# Patient Record
Sex: Female | Born: 1975 | Race: Black or African American | Hispanic: No | Marital: Single | Smoking: Never smoker
Health system: Southern US, Community
[De-identification: ages and names within clinical notes are randomized; demographics above are authoritative.]

## PROBLEM LIST (undated history)

## (undated) DIAGNOSIS — B999 Unspecified infectious disease: Secondary | ICD-10-CM

## (undated) HISTORY — PX: NO PAST SURGERIES: SHX2092

---

## 2003-08-04 ENCOUNTER — Inpatient Hospital Stay (HOSPITAL_COMMUNITY): Admission: AD | Admit: 2003-08-04 | Discharge: 2003-08-06 | Payer: Self-pay | Admitting: Family Medicine

## 2003-10-26 ENCOUNTER — Ambulatory Visit (HOSPITAL_COMMUNITY): Admission: RE | Admit: 2003-10-26 | Discharge: 2003-10-26 | Payer: Self-pay | Admitting: *Deleted

## 2004-01-08 ENCOUNTER — Ambulatory Visit (HOSPITAL_COMMUNITY): Admission: RE | Admit: 2004-01-08 | Discharge: 2004-01-08 | Payer: Self-pay | Admitting: *Deleted

## 2004-03-08 ENCOUNTER — Inpatient Hospital Stay (HOSPITAL_COMMUNITY): Admission: AD | Admit: 2004-03-08 | Discharge: 2004-03-11 | Payer: Self-pay | Admitting: *Deleted

## 2005-10-21 ENCOUNTER — Inpatient Hospital Stay (HOSPITAL_COMMUNITY): Admission: RE | Admit: 2005-10-21 | Discharge: 2005-10-23 | Payer: Self-pay | Admitting: Obstetrics and Gynecology

## 2011-10-07 NOTE — L&D Delivery Note (Signed)
Delivery Note At 5:43 AM a viable and healthy female was delivered via Vaginal, Spontaneous Delivery (Presentation: Occiput Anterior).  APGAR: 8, 9; weight 7 lb 7.8 oz (3396 g).   Placenta status: Intact, Spontaneous.  Cord: 3 vessel with the following complications: None.   Anesthesia: None  Episiotomy: None Lacerations: None Suture Repair: n/a Est. Blood Loss (mL): 300  Mom to postpartum.  Baby to nursery-stable.  Marikay Alar 04/28/2012, 6:22 AM  I have seen and examined this patient and I agree with the above. Cam Hai 6:48 AM 04/28/2012

## 2011-10-13 LAB — OB RESULTS CONSOLE GC/CHLAMYDIA
Chlamydia: NEGATIVE
Gonorrhea: NEGATIVE

## 2011-10-13 LAB — OB RESULTS CONSOLE ABO/RH: RH Type: POSITIVE

## 2012-03-12 ENCOUNTER — Other Ambulatory Visit: Payer: Self-pay | Admitting: Obstetrics and Gynecology

## 2012-03-12 ENCOUNTER — Other Ambulatory Visit (HOSPITAL_COMMUNITY)
Admission: RE | Admit: 2012-03-12 | Discharge: 2012-03-12 | Disposition: A | Discharge status: Home / Self Care | Payer: Commercial Indemnity | Source: Ambulatory Visit | Attending: Obstetrics and Gynecology | Admitting: Obstetrics and Gynecology

## 2012-03-12 DIAGNOSIS — Z01419 Encounter for gynecological examination (general) (routine) without abnormal findings: Secondary | ICD-10-CM | POA: Insufficient documentation

## 2012-03-12 DIAGNOSIS — Z1159 Encounter for screening for other viral diseases: Secondary | ICD-10-CM | POA: Insufficient documentation

## 2012-04-12 LAB — OB RESULTS CONSOLE GC/CHLAMYDIA: Gonorrhea: NEGATIVE

## 2012-04-12 LAB — OB RESULTS CONSOLE GBS: GBS: NEGATIVE

## 2012-04-27 ENCOUNTER — Inpatient Hospital Stay (HOSPITAL_COMMUNITY)
Admission: AD | Admit: 2012-04-27 | Discharge: 2012-04-30 | DRG: 767 | Disposition: A | Discharge status: Home / Self Care | Payer: Managed Care, Other (non HMO) | Source: Ambulatory Visit | Attending: Obstetrics & Gynecology | Admitting: Obstetrics & Gynecology

## 2012-04-27 ENCOUNTER — Encounter (HOSPITAL_COMMUNITY): Payer: Self-pay | Admitting: *Deleted

## 2012-04-27 DIAGNOSIS — B999 Unspecified infectious disease: Secondary | ICD-10-CM | POA: Insufficient documentation

## 2012-04-27 DIAGNOSIS — Z302 Encounter for sterilization: Secondary | ICD-10-CM

## 2012-04-27 DIAGNOSIS — O09529 Supervision of elderly multigravida, unspecified trimester: Secondary | ICD-10-CM | POA: Diagnosis present

## 2012-04-27 HISTORY — DX: Unspecified infectious disease: B99.9

## 2012-04-27 LAB — CBC
HCT: 36.5 % (ref 36.0–46.0)
Hemoglobin: 12 g/dL (ref 12.0–15.0)
RBC: 4.12 MIL/uL (ref 3.87–5.11)
WBC: 9.4 10*3/uL (ref 4.0–10.5)

## 2012-04-27 MED ORDER — ACETAMINOPHEN 325 MG PO TABS: 650.0000 mg | ORAL_TABLET | ORAL | Status: DC | PRN

## 2012-04-27 MED ORDER — OXYTOCIN BOLUS FROM INFUSION
250.0000 mL | Freq: Once | INTRAVENOUS | Status: AC
  Administered 2012-04-28: 250 mL via INTRAVENOUS
  Filled 2012-04-27: qty 500

## 2012-04-27 MED ORDER — BUTORPHANOL TARTRATE 2 MG/ML IJ SOLN
1.0000 mg | INTRAMUSCULAR | Status: DC | PRN
  Administered 2012-04-28 (×2): 1 mg via INTRAVENOUS
  Filled 2012-04-27 (×2): qty 1

## 2012-04-27 MED ORDER — OXYCODONE-ACETAMINOPHEN 5-325 MG PO TABS: 1.0000 | ORAL_TABLET | ORAL | Status: DC | PRN

## 2012-04-27 MED ORDER — LACTATED RINGERS IV SOLN: 500.0000 mL | INTRAVENOUS | Status: DC | PRN

## 2012-04-27 MED ORDER — LACTATED RINGERS IV SOLN
INTRAVENOUS | Status: DC
  Administered 2012-04-27: 23:00:00 via INTRAVENOUS

## 2012-04-27 MED ORDER — FLEET ENEMA 7-19 GM/118ML RE ENEM: 1.0000 | ENEMA | RECTAL | Status: DC | PRN

## 2012-04-27 MED ORDER — IBUPROFEN 600 MG PO TABS: 600.0000 mg | ORAL_TABLET | Freq: Four times a day (QID) | ORAL | Status: DC | PRN

## 2012-04-27 MED ORDER — LIDOCAINE HCL (PF) 1 % IJ SOLN
30.0000 mL | INTRAMUSCULAR | Status: DC | PRN
  Filled 2012-04-27: qty 30

## 2012-04-27 MED ORDER — CITRIC ACID-SODIUM CITRATE 334-500 MG/5ML PO SOLN: 30.0000 mL | ORAL | Status: DC | PRN

## 2012-04-27 MED ORDER — OXYTOCIN 40 UNITS IN LACTATED RINGERS INFUSION - SIMPLE MED
62.5000 mL/h | Freq: Once | INTRAVENOUS | Status: AC
  Administered 2012-04-28: 62.5 mL/h via INTRAVENOUS
  Filled 2012-04-27: qty 1000

## 2012-04-27 MED ORDER — ACYCLOVIR 400 MG PO TABS
400.0000 mg | ORAL_TABLET | Freq: Two times a day (BID) | ORAL | Status: DC
  Administered 2012-04-27: 400 mg via ORAL
  Filled 2012-04-27 (×2): qty 1

## 2012-04-27 MED ORDER — ONDANSETRON HCL 4 MG/2ML IJ SOLN: 4.0000 mg | Freq: Four times a day (QID) | INTRAMUSCULAR | Status: DC | PRN

## 2012-04-27 NOTE — H&P (Signed)
Meagan Bell is a 36 y.o. female presenting for rupture of membranes and contractions. Patient states that her water broke around 7 pm this evening.  States it was a big gush of fluid.  States contractions started at some point before this.  They were every 10 minutes earlier today.  She denies any bleeding.   Maternal Medical History:  Reason for admission: Reason for admission: rupture of membranes and contractions.  Contractions: Onset was 3-5 hours ago.   Frequency: regular.   Perceived severity is moderate.    Fetal activity: Perceived fetal activity is normal.   Last perceived fetal movement was within the past hour.    Prenatal Complications - Diabetes: none.    OB History    Grav Para Term Preterm Abortions TAB SAB Ect Mult Living   3 2 2       2      [facility]  Past Medical History  Diagnosis Date  . No pertinent past medical history    Past Surgical History  Procedure Date  . No past surgeries    Family History: family history is negative for Other. Social History:  reports that she has never smoked. She does not have any smokeless tobacco history on file. She reports that she does not drink alcohol or use illicit drugs.   Prenatal Transfer Tool  Maternal Diabetes: No Genetic Screening: Declined Maternal Ultrasounds/Referrals: Normal Fetal Ultrasounds or other Referrals:  None Maternal Substance Abuse:  No Significant Maternal Medications:  Meds include: Other: see prenatal record Significant Maternal Lab Results:  Lab values include: Group B Strep negative Other Comments:  None  Review of Systems  Constitutional: Negative for fever and chills.  Eyes: Negative for blurred vision.  Respiratory: Negative for shortness of breath.   Cardiovascular: Negative for chest pain.  Neurological: Negative for dizziness and headaches.    Dilation: 3.5 Effacement (%): Thick Station: -2 Exam by:: Rudi Coco RN Blood pressure 121/74, pulse 100,  temperature 98.2 F (36.8 C), temperature source Oral, resp. rate 16, height 5' 9.5" (1.765 m), weight 85.73 kg (189 lb), SpO2 100.00%. Exam Physical Exam  Constitutional: She is oriented to person, place, and time. She appears well-developed and well-nourished.  HENT:  Head: Normocephalic and atraumatic.  Cardiovascular: Normal rate, regular rhythm and normal heart sounds.   Respiratory: Effort normal and breath sounds normal.  GI: Soft. There is no tenderness. There is no rebound and no guarding.       Gravid abdomen  Musculoskeletal: She exhibits no edema.  Neurological: She is alert and oriented to person, place, and time.  Psychiatric: She has a normal mood and affect.   Positive ferning   Prenatal labs: ABO, Rh:   A positive Antibody:   negative Rubella:   immune RPR:   negative HBsAg:   negative HIV:   negative GBS:   negative  HSV II positive, has been on valtrex, has not had any recent lesions  Assessment/Plan: Patient is a G3P2002 at 39.2 EGA.  Spontaneous rupture of membranes. Will admit patient to labor and delivery. Support patient through labor.   Marikay Alar 04/27/2012, 9:50 PM  I have seen and examined this patient and I agree with the above. Cam Hai 12:47 AM 04/28/2012

## 2012-04-27 NOTE — MAU Note (Signed)
Pt reports ROM clear fluid at 1900, some contractions

## 2012-04-28 ENCOUNTER — Encounter (HOSPITAL_COMMUNITY): Payer: Self-pay | Admitting: *Deleted

## 2012-04-28 DIAGNOSIS — O0940 Supervision of pregnancy with grand multiparity, unspecified trimester: Secondary | ICD-10-CM

## 2012-04-28 DIAGNOSIS — O09529 Supervision of elderly multigravida, unspecified trimester: Secondary | ICD-10-CM

## 2012-04-28 LAB — RPR: RPR Ser Ql: NONREACTIVE

## 2012-04-28 MED ORDER — BENZOCAINE-MENTHOL 20-0.5 % EX AERO: 1.0000 "application " | INHALATION_SPRAY | CUTANEOUS | Status: DC | PRN

## 2012-04-28 MED ORDER — WITCH HAZEL-GLYCERIN EX PADS: 1.0000 "application " | MEDICATED_PAD | CUTANEOUS | Status: DC | PRN

## 2012-04-28 MED ORDER — ONDANSETRON HCL 4 MG/2ML IJ SOLN: 4.0000 mg | INTRAMUSCULAR | Status: DC | PRN

## 2012-04-28 MED ORDER — SIMETHICONE 80 MG PO CHEW: 80.0000 mg | CHEWABLE_TABLET | ORAL | Status: DC | PRN

## 2012-04-28 MED ORDER — OXYTOCIN 40 UNITS IN LACTATED RINGERS INFUSION - SIMPLE MED
1.0000 m[IU]/min | INTRAVENOUS | Status: DC
  Administered 2012-04-28: 2 m[IU]/min via INTRAVENOUS

## 2012-04-28 MED ORDER — TERBUTALINE SULFATE 1 MG/ML IJ SOLN: 0.2500 mg | Freq: Once | INTRAMUSCULAR | Status: DC | PRN

## 2012-04-28 MED ORDER — IBUPROFEN 600 MG PO TABS
600.0000 mg | ORAL_TABLET | Freq: Four times a day (QID) | ORAL | Status: DC
  Administered 2012-04-28 – 2012-04-30 (×8): 600 mg via ORAL
  Filled 2012-04-28 (×9): qty 1

## 2012-04-28 MED ORDER — ZOLPIDEM TARTRATE 5 MG PO TABS: 5.0000 mg | ORAL_TABLET | Freq: Every evening | ORAL | Status: DC | PRN

## 2012-04-28 MED ORDER — ONDANSETRON HCL 4 MG PO TABS: 4.0000 mg | ORAL_TABLET | ORAL | Status: DC | PRN

## 2012-04-28 MED ORDER — OXYCODONE-ACETAMINOPHEN 5-325 MG PO TABS
1.0000 | ORAL_TABLET | ORAL | Status: DC | PRN
  Administered 2012-04-30: 1 via ORAL
  Filled 2012-04-28: qty 2

## 2012-04-28 MED ORDER — PRENATAL MULTIVITAMIN CH
1.0000 | ORAL_TABLET | Freq: Every day | ORAL | Status: DC
  Administered 2012-04-28 – 2012-04-30 (×2): 1 via ORAL
  Filled 2012-04-28 (×2): qty 1

## 2012-04-28 MED ORDER — DIPHENHYDRAMINE HCL 25 MG PO CAPS: 25.0000 mg | ORAL_CAPSULE | Freq: Four times a day (QID) | ORAL | Status: DC | PRN

## 2012-04-28 MED ORDER — TETANUS-DIPHTH-ACELL PERTUSSIS 5-2.5-18.5 LF-MCG/0.5 IM SUSP: 0.5000 mL | Freq: Once | INTRAMUSCULAR | Status: DC

## 2012-04-28 MED ORDER — SENNOSIDES-DOCUSATE SODIUM 8.6-50 MG PO TABS
2.0000 | ORAL_TABLET | Freq: Every day | ORAL | Status: DC
  Administered 2012-04-28 – 2012-04-29 (×2): 2 via ORAL

## 2012-04-28 MED ORDER — DIBUCAINE 1 % RE OINT: 1.0000 "application " | TOPICAL_OINTMENT | RECTAL | Status: DC | PRN

## 2012-04-28 MED ORDER — LANOLIN HYDROUS EX OINT: TOPICAL_OINTMENT | CUTANEOUS | Status: DC | PRN

## 2012-04-28 NOTE — Progress Notes (Signed)
Subjective: Patient doing well.  Feeling more frequent contractions since starting the pitocin.  Objective: BP 122/80  Pulse 94  Temp 97.7 F (36.5 C) (Oral)  Resp 18  Ht 5' 9.5" (1.765 m)  Wt 85.73 kg (189 lb)  BMI 27.51 kg/m2  SpO2 100%      FHT:  FHR: 135 bpm, variability: moderate,  accelerations:  Abscent,  decelerations:  Absent UC:   irregular, every 1-5 minutes SVE:   Dilation: 4 Effacement (%): 70 Station: -2 Exam by:: [email or web address]-[email or web address], rnc  Labs: Lab Results  Component Value Date   WBC 9.4 04/27/2012   HGB 12.0 04/27/2012   HCT 36.5 04/27/2012   MCV 88.6 04/27/2012   PLT 146* 04/27/2012    Assessment / Plan: Pitocin started.  Contractions increasing in frequency.  Will continue to support through labor.  Marikay Alar 04/28/2012, 3:11 AM

## 2012-04-28 NOTE — Progress Notes (Signed)
Patient came from L&D with a pain scale of 8 out of 10.

## 2012-04-29 ENCOUNTER — Encounter (HOSPITAL_COMMUNITY): Payer: Self-pay | Admitting: Obstetrics & Gynecology

## 2012-04-29 ENCOUNTER — Encounter (HOSPITAL_COMMUNITY)
Admission: AD | Disposition: A | Discharge status: Home / Self Care | Payer: Self-pay | Source: Ambulatory Visit | Attending: Obstetrics & Gynecology

## 2012-04-29 ENCOUNTER — Inpatient Hospital Stay (HOSPITAL_COMMUNITY): Payer: Managed Care, Other (non HMO) | Admitting: Anesthesiology

## 2012-04-29 ENCOUNTER — Encounter (HOSPITAL_COMMUNITY): Payer: Self-pay | Admitting: Anesthesiology

## 2012-04-29 DIAGNOSIS — Z302 Encounter for sterilization: Secondary | ICD-10-CM

## 2012-04-29 HISTORY — PX: TUBAL LIGATION: SHX77

## 2012-04-29 LAB — SURGICAL PCR SCREEN: Staphylococcus aureus: NEGATIVE

## 2012-04-29 SURGERY — LIGATION, FALLOPIAN TUBE, POSTPARTUM
Anesthesia: Spinal | Site: Abdomen | Laterality: Bilateral | Wound class: Clean Contaminated

## 2012-04-29 MED ORDER — LACTATED RINGERS IV SOLN
INTRAVENOUS | Status: DC | PRN
  Administered 2012-04-29: 09:00:00 via INTRAVENOUS

## 2012-04-29 MED ORDER — LACTATED RINGERS IV SOLN: INTRAVENOUS | Status: DC

## 2012-04-29 MED ORDER — LACTATED RINGERS IV SOLN: INTRAVENOUS | Status: DC | PRN

## 2012-04-29 MED ORDER — MIDAZOLAM HCL 2 MG/2ML IJ SOLN
INTRAMUSCULAR | Status: AC
  Filled 2012-04-29: qty 2

## 2012-04-29 MED ORDER — FENTANYL CITRATE 0.05 MG/ML IJ SOLN: 25.0000 ug | INTRAMUSCULAR | Status: DC | PRN

## 2012-04-29 MED ORDER — KETOROLAC TROMETHAMINE 30 MG/ML IJ SOLN
INTRAMUSCULAR | Status: AC
  Filled 2012-04-29: qty 1

## 2012-04-29 MED ORDER — ONDANSETRON HCL 4 MG/2ML IJ SOLN
INTRAMUSCULAR | Status: AC
  Filled 2012-04-29: qty 2

## 2012-04-29 MED ORDER — ONDANSETRON HCL 4 MG/2ML IJ SOLN
INTRAMUSCULAR | Status: DC | PRN
  Administered 2012-04-29: 4 mg via INTRAVENOUS

## 2012-04-29 MED ORDER — METOCLOPRAMIDE HCL 10 MG PO TABS
10.0000 mg | ORAL_TABLET | Freq: Once | ORAL | Status: AC
  Administered 2012-04-29: 10 mg via ORAL
  Filled 2012-04-29: qty 1

## 2012-04-29 MED ORDER — KETOROLAC TROMETHAMINE 30 MG/ML IJ SOLN: 15.0000 mg | Freq: Once | INTRAMUSCULAR | Status: AC | PRN

## 2012-04-29 MED ORDER — BUPIVACAINE HCL (PF) 0.5 % IJ SOLN
INTRAMUSCULAR | Status: AC
  Filled 2012-04-29: qty 30

## 2012-04-29 MED ORDER — DEXAMETHASONE SODIUM PHOSPHATE 10 MG/ML IJ SOLN
INTRAMUSCULAR | Status: AC
  Filled 2012-04-29: qty 1

## 2012-04-29 MED ORDER — FENTANYL CITRATE 0.05 MG/ML IJ SOLN
INTRAMUSCULAR | Status: AC
  Filled 2012-04-29: qty 2

## 2012-04-29 MED ORDER — KETOROLAC TROMETHAMINE 30 MG/ML IJ SOLN
INTRAMUSCULAR | Status: DC | PRN
  Administered 2012-04-29: 30 mg via INTRAVENOUS

## 2012-04-29 MED ORDER — BUPIVACAINE HCL (PF) 0.5 % IJ SOLN
INTRAMUSCULAR | Status: DC | PRN
  Administered 2012-04-29: 10 mL

## 2012-04-29 MED ORDER — FAMOTIDINE 20 MG PO TABS
40.0000 mg | ORAL_TABLET | Freq: Once | ORAL | Status: AC
  Administered 2012-04-29: 40 mg via ORAL
  Filled 2012-04-29: qty 2

## 2012-04-29 MED ORDER — BUPIVACAINE IN DEXTROSE 0.75-8.25 % IT SOLN
INTRATHECAL | Status: DC | PRN
  Administered 2012-04-29: 1.4 mL via INTRATHECAL

## 2012-04-29 MED ORDER — MIDAZOLAM HCL 5 MG/5ML IJ SOLN
INTRAMUSCULAR | Status: DC | PRN
  Administered 2012-04-29: 2 mg via INTRAVENOUS

## 2012-04-29 SURGICAL SUPPLY — 29 items
APL SKNCLS STERI-STRIP NONHPOA (GAUZE/BANDAGES/DRESSINGS)
BENZOIN TINCTURE PRP APPL 2/3 (GAUZE/BANDAGES/DRESSINGS) IMPLANT
CHLORAPREP W/TINT 26ML (MISCELLANEOUS) ×2 IMPLANT
CLIP FILSHIE TUBAL LIGA STRL (Clip) ×2 IMPLANT
CLOTH BEACON ORANGE TIMEOUT ST (SAFETY) ×2 IMPLANT
DRSG COVADERM PLUS 2X2 (GAUZE/BANDAGES/DRESSINGS) ×1 IMPLANT
DRSG TEGADERM 2.38X2.75 (GAUZE/BANDAGES/DRESSINGS) ×1 IMPLANT
ELECT REM PT RETURN 9FT ADLT (ELECTROSURGICAL)
ELECTRODE REM PT RTRN 9FT ADLT (ELECTROSURGICAL) IMPLANT
GLOVE BIO SURGEON STRL SZ7 (GLOVE) ×2 IMPLANT
GLOVE BIOGEL PI IND STRL 7.0 (GLOVE) ×2 IMPLANT
GLOVE BIOGEL PI INDICATOR 7.0 (GLOVE) ×2
GOWN PREVENTION PLUS LG XLONG (DISPOSABLE) ×4 IMPLANT
NDL HYPO 25X1 1.5 SAFETY (NEEDLE) ×1 IMPLANT
NEEDLE HYPO 25X1 1.5 SAFETY (NEEDLE) ×2 IMPLANT
NS IRRIG 1000ML POUR BTL (IV SOLUTION) ×2 IMPLANT
PACK ABDOMINAL MINOR (CUSTOM PROCEDURE TRAY) ×2 IMPLANT
PENCIL BUTTON HOLSTER BLD 10FT (ELECTRODE) ×2 IMPLANT
SPONGE GAUZE 2X2 8PLY STRL LF (GAUZE/BANDAGES/DRESSINGS) ×1 IMPLANT
SPONGE LAP 4X18 X RAY DECT (DISPOSABLE) IMPLANT
STRIP CLOSURE SKIN 1/2X4 (GAUZE/BANDAGES/DRESSINGS) IMPLANT
SUT CHROMIC 2 0 TIES 18 (SUTURE) IMPLANT
SUT VIC AB 0 CT1 27 (SUTURE) ×2
SUT VIC AB 0 CT1 27XBRD ANBCTR (SUTURE) ×1 IMPLANT
SUT VICRYL 4-0 PS2 18IN ABS (SUTURE) ×2 IMPLANT
SYR CONTROL 10ML LL (SYRINGE) ×2 IMPLANT
TOWEL OR 17X24 6PK STRL BLUE (TOWEL DISPOSABLE) ×4 IMPLANT
TRAY FOLEY CATH 14FR (SET/KITS/TRAYS/PACK) ×2 IMPLANT
WATER STERILE IRR 1000ML POUR (IV SOLUTION) ×2 IMPLANT

## 2012-04-29 NOTE — Anesthesia Postprocedure Evaluation (Signed)
  Anesthesia Post-op Note  Patient: Meagan Bell  Procedure(s) Performed: Procedure(s) (LRB): POST PARTUM TUBAL LIGATION (Bilateral)  Patient Location: Mother/Baby  Anesthesia Type: Spinal  Level of Consciousness: awake  Airway and Oxygen Therapy: Patient Spontanous Breathing  Post-op Pain: mild  Post-op Assessment: Patient's Cardiovascular Status Stable and Respiratory Function Stable  Post-op Vital Signs: stable  Complications: No apparent anesthesia complications

## 2012-04-29 NOTE — Addendum Note (Signed)
Addendum  created 04/29/12 1944 by Renford Dills, CRNA   Modules edited:Notes Section

## 2012-04-29 NOTE — Plan of Care (Signed)
Problem: Discharge Progression Outcomes Goal: Barriers To Progression Addressed/Resolved Outcome: Completed/Met Date Met:  04/29/12 Pt had BTL today and has small dressing over incision 2x2.

## 2012-04-29 NOTE — H&P (View-Only) (Signed)
Post Partum Day 1 Subjective: no complaints and tolerating PO  Objective: Blood pressure 118/76, pulse 91, temperature 98.1 F (36.7 C), temperature source Oral, resp. rate 18, height 5' 9.5" (1.765 m), weight 85.73 kg (189 lb), SpO2 99.00%, unknown if currently breastfeeding.  Physical Exam:  General: alert and cooperative Lochia: appropriate Uterine Fundus: firm Incision: n/a DVT Evaluation: No evidence of DVT seen on physical exam. Negative Homan's sign. No cords or calf tenderness. No significant calf/ankle edema.   Basename 04/27/12 2230  HGB 12.0  HCT 36.5    Assessment/Plan: Plan for discharge tomorrow and Contraception papers signed for BTL, hasnt eaten since last night. Bottle feeding.   LOS: 2 days   Meagan Bell 04/29/2012, 7:30 AM

## 2012-04-29 NOTE — Progress Notes (Signed)
Post Partum Day 1 Subjective: no complaints and tolerating PO  Objective: Blood pressure 118/76, pulse 91, temperature 98.1 F (36.7 C), temperature source Oral, resp. rate 18, height 5' 9.5" (1.765 m), weight 85.73 kg (189 lb), SpO2 99.00%, unknown if currently breastfeeding.  Physical Exam:  General: alert and cooperative Lochia: appropriate Uterine Fundus: firm Incision: n/a DVT Evaluation: No evidence of DVT seen on physical exam. Negative Homan's sign. No cords or calf tenderness. No significant calf/ankle edema.   Basename 04/27/12 2230  HGB 12.0  HCT 36.5    Assessment/Plan: Plan for discharge tomorrow and Contraception papers signed for BTL, hasnt eaten since last night. Bottle feeding.   LOS: 2 days   Meagan Bell 04/29/2012, 7:30 AM    

## 2012-04-29 NOTE — Interval H&P Note (Signed)
History and Physical Interval Note:  Meagan Bell  has presented today for surgery, with the diagnosis of undesired fertility, desires sterilization.  Other reversible forms of contraception were discussed with patient; she declines all other modalities. Risks of procedure discussed with patient including but not limited to: risk of regret, permanence of method, bleeding, infection, injury to surrounding organs and need for additional procedures.  Failure risk of 0.5-1% with increased risk of ectopic gestation if pregnancy occurs was also discussed with patient.  Questions were answered to the patient's satisfaction.  Patient verbalized understanding of these risks and wants to proceed with sterilization.  The patient's history has been reviewed, patient examined, no change in status, stable for surgery.  I have reviewed the patient's chart and labs.  Written informed consent obtained.  To OR when ready.  Jaynie Collins, M.D. 04/29/2012 8:44 AM

## 2012-04-29 NOTE — Anesthesia Postprocedure Evaluation (Signed)
Anesthesia Post Note  Patient: Meagan Bell  Procedure(s) Performed: Procedure(s) (LRB): POST PARTUM TUBAL LIGATION (Bilateral)  Anesthesia type: Spinal  Patient location: PACU  Post pain: Pain level controlled  Post assessment: Post-op Vital signs reviewed  Last Vitals:  Filed Vitals:   04/29/12 1230  BP: 109/72  Pulse: 80  Temp:   Resp: 15    Post vital signs: Reviewed  Level of consciousness: awake  Complications: No apparent anesthesia complications

## 2012-04-29 NOTE — Op Note (Signed)
Meagan Bell 04/27/2012 - 04/29/2012  PREOPERATIVE DIAGNOSIS:  Multiparity, undesired fertility  POSTOPERATIVE DIAGNOSIS:  Multiparity, undesired fertility  PROCEDURE:  Postpartum Bilateral Tubal Sterilization using Filshie Clips   ANESTHESIA:  Epidural and local analgesia using 0.5% Marcaine  COMPLICATIONS:  None immediate.  ESTIMATED BLOOD LOSS: 10 ml.  FLUIDS: 600 ml LR.  URINE OUTPUT:  50 ml of clear urine.  INDICATIONS: 36 y.o. [id number] with undesired fertility,status post vaginal delivery, desires permanent sterilization.  Other reversible forms of contraception were discussed with patient; she declines all other modalities. Risks of procedure discussed with patient including but not limited to: risk of regret, permanence of method, bleeding, infection, injury to surrounding organs and need for additional procedures.  Failure risk of 0.5-1% with increased risk of ectopic gestation if pregnancy occurs was also discussed with patient.     FINDINGS:  Normal uterus, tubes, and ovaries.  PROCEDURE DETAILS: The patient was taken to the operating room where her epidural anesthesia was dosed up to surgical level and found to be adequate.  She was then placed in the dorsal supine position and prepped and draped in sterile fashion.  After an adequate timeout was performed, attention was turned to the patient's abdomen where a small transverse skin incision was made under the umbilical fold. The incision was taken down to the layer of fascia using the scalpel, and fascia was incised, and extended bilaterally using Mayo scissors. The peritoneum was entered in a sharp fashion. Attention was then turned to the patient's uterus, and left fallopian tube was identified and followed out to the fimbriated end.  A Filshie clip was placed on the left fallopian tube about 3 cm from the cornual attachment, with care given to incorporate the underlying mesosalpinx.  A similar process was carried out on the right  side allowing for bilateral tubal sterilization.  Good hemostasis was noted overall.  Local analgesia was injected into both Filshie application sites.The instruments were then removed from the patient's abdomen and the fascial incision was repaired with 0 Vicryl, and the skin was closed with a 4-0 Vicryl subcuticular stitch. The patient tolerated the procedure well.  Instrument, sponge, and needle counts were correct times two.  The patient was then taken to the recovery room awake and in stable condition.  [name] A 04/29/2012 9:42 AM

## 2012-04-29 NOTE — Progress Notes (Signed)
I examined pt and agree with documentation above and resident plan of care. MUHAMMAD,WALIDAH  

## 2012-04-29 NOTE — Progress Notes (Signed)
Patient expressed to dayshift RN and to myself that she had signed 30 day papers for BTL in the doctor's office.  Notified Dr. Birdie Sons. No new orders. Will keep patient informed.

## 2012-04-29 NOTE — Anesthesia Preprocedure Evaluation (Addendum)
Anesthesia Evaluation  Patient identified by MRN, date of birth, ID band Patient awake    Reviewed: Allergy & Precautions, H&P , NPO status , Patient's Chart, lab work & pertinent test results, reviewed documented beta blocker date and time   History of Anesthesia Complications Negative for: history of anesthetic complications  Airway Mallampati: I TM Distance: >3 FB Neck ROM: full    Dental  (+) Teeth Intact   Pulmonary neg pulmonary ROS,  breath sounds clear to auscultation        Cardiovascular negative cardio ROS  Rhythm:regular Rate:Normal     Neuro/Psych negative neurological ROS  negative psych ROS   GI/Hepatic negative GI ROS, Neg liver ROS,   Endo/Other  negative endocrine ROS  Renal/GU negative Renal ROS     Musculoskeletal   Abdominal   Peds  Hematology negative hematology ROS (+)   Anesthesia Other Findings   Reproductive/Obstetrics (+) Pregnancy (s/p SVD)                          Anesthesia Physical Anesthesia Plan  ASA: II  Anesthesia Plan: Spinal   Post-op Pain Management:    Induction:   Airway Management Planned:   Additional Equipment:   Intra-op Plan:   Post-operative Plan:   Informed Consent: I have reviewed the patients History and Physical, chart, labs and discussed the procedure including the risks, benefits and alternatives for the proposed anesthesia with the patient or authorized representative who has indicated his/her understanding and acceptance.     Plan Discussed with: Surgeon and CRNA  Anesthesia Plan Comments:         Anesthesia Quick Evaluation

## 2012-04-29 NOTE — Transfer of Care (Signed)
Immediate Anesthesia Transfer of Care Note  Patient: Meagan Bell  Procedure(s) Performed: Procedure(s) (LRB): POST PARTUM TUBAL LIGATION (Bilateral)  Patient Location: PACU  Anesthesia Type: Spinal  Level of Consciousness: awake and oriented  Airway & Oxygen Therapy: Patient Spontanous Breathing  Post-op Assessment: Report given to PACU RN and Post -op Vital signs reviewed and stable  Post vital signs: Reviewed and stable  Complications: No apparent anesthesia complications

## 2012-04-29 NOTE — Anesthesia Procedure Notes (Signed)
Spinal  Patient location during procedure: OR Start time: 04/29/2012 9:51 AM Staffing Performed by: other anesthesia staff  Preanesthetic Checklist Completed: patient identified, site marked, surgical consent, pre-op evaluation, timeout performed, IV checked, risks and benefits discussed and monitors and equipment checked Spinal Block Patient position: sitting Prep: site prepped and draped and DuraPrep Patient monitoring: heart rate, cardiac monitor, continuous pulse ox and blood pressure Approach: midline Location: L3-4 Injection technique: single-shot Needle Needle type: Sprotte  Needle gauge: 24 G Needle length: 9 cm Assessment Sensory level: T4 Additional Notes Clear free flow CSF on first attempt by SRNA.  No paresthesia.  Patient tolerated procedure well.  Jasmine December, MD

## 2012-04-30 MED ORDER — IBUPROFEN 600 MG PO TABS: 600.0000 mg | ORAL_TABLET | Freq: Four times a day (QID) | ORAL | Status: AC

## 2012-04-30 NOTE — Discharge Summary (Signed)
  Obstetric Discharge Summary Reason for Admission: onset of labor Prenatal Procedures: ultrasound Intrapartum Procedures: spontaneous vaginal delivery Postpartum Procedures: P.P. tubal ligation Complications-Operative and Postpartum: none   Delivery Note At 5:43 AM a viable female was delivered via Vaginal, Spontaneous Delivery (Presentation: Middle Occiput Anterior).  APGAR: 8, 9; weight 7 lb 7.8 oz (3396 g).   Placenta status: Intact, Spontaneous.  Cord:  with the following complications: None.   Anesthesia: None  Episiotomy: None Lacerations: None Suture Repair: n/a Est. Blood Loss (mL): 300  Mom to postpartum.  Baby to nursery-stable.  [name],Meagan Bell 04/30/2012, 7:33 AM     H/H: Lab Results  Component Value Date/Time   HGB 12.0 04/27/2012 10:30 PM   HCT 36.5 04/27/2012 10:30 PM      Discharge Diagnoses: Term Pregnancy-delivered  Discharge Information: Date: 04/17/2011 Activity: pelvic rest Diet: routine  Medications: Ibuprofen Breast feeding:  No:  Condition: stable Instructions: refer to practice specific booklet Discharge to: home   Discharge Orders    Future Orders Please Complete By Expires   OB RESULT CONSOLE Group B Strep      Comments:   This external order was created through the Results Console.   OB RESULTS CONSOLE GC/Chlamydia      Comments:   This external order was created through the Results Console.   OB RESULTS CONSOLE GC/Chlamydia      Comments:   This external order was created through the Results Console.   OB RESULTS CONSOLE Rubella Antibody      Comments:   This external order was created through the Results Console.   OB RESULTS CONSOLE Hepatitis B surface antigen      Comments:   This external order was created through the Results Console.   OB RESULTS CONSOLE ABO/Rh      Comments:   This external order was created through the Results Console.     Medication List  As of 04/30/2012  7:33 AM   STOP taking these  medications         acyclovir 400 MG tablet         TAKE these medications         ibuprofen 600 MG tablet   Commonly known as: ADVIL,MOTRIN   Take 1 tablet (600 mg total) by mouth every 6 (six) hours.           Follow-up Information    Follow up with Tilda Burrow, MD. Schedule an appointment as soon as possible for a visit in 4 weeks.   Contact information:   [facility] [address] [place] [address] [phone]          [name],Meagan Bell 04/30/2012,7:33 AM

## 2012-04-30 NOTE — Discharge Summary (Signed)
Attestation of Attending Supervision of Advanced Practitioner (CNM/NP): Evaluation and management procedures were performed by the Advanced Practitioner under my supervision and collaboration.  I have reviewed the Advanced Practitioner's note and chart, and I agree with the management and plan.  Jaynie Collins, M.D. 04/30/2012 7:57 AM

## 2012-05-01 ENCOUNTER — Encounter (HOSPITAL_COMMUNITY): Payer: Self-pay | Admitting: Obstetrics & Gynecology

## 2012-05-04 NOTE — H&P (Signed)
Please see her H&P by Philipp Deputy, CNM

## 2014-01-16 ENCOUNTER — Encounter: Payer: Self-pay | Admitting: Nurse Practitioner

## 2014-01-16 ENCOUNTER — Ambulatory Visit (INDEPENDENT_AMBULATORY_CARE_PROVIDER_SITE_OTHER): Payer: Commercial Indemnity | Admitting: Nurse Practitioner

## 2014-01-16 VITALS — BP 120/82 | Ht 67.0 in | Wt 180.0 lb

## 2014-01-16 DIAGNOSIS — Z124 Encounter for screening for malignant neoplasm of cervix: Secondary | ICD-10-CM

## 2014-01-16 DIAGNOSIS — Z01419 Encounter for gynecological examination (general) (routine) without abnormal findings: Secondary | ICD-10-CM

## 2014-01-16 DIAGNOSIS — Z0000 Encounter for general adult medical examination without abnormal findings: Secondary | ICD-10-CM

## 2014-01-16 DIAGNOSIS — Z113 Encounter for screening for infections with a predominantly sexual mode of transmission: Secondary | ICD-10-CM

## 2014-01-18 ENCOUNTER — Encounter: Payer: Self-pay | Admitting: Nurse Practitioner

## 2014-01-18 LAB — PAP IG, CT-NG NAA, HPV HIGH-RISK
Chlamydia Probe Amp: NEGATIVE
GC Probe Amp: NEGATIVE
HPV DNA HIGH RISK: DETECTED — AB

## 2014-01-18 NOTE — Progress Notes (Signed)
   Subjective:    Patient ID: Meagan Bell, female    DOB: 11-10-1975, 38 y.o.   MRN: [id number]  HPI  presents for her wellness checkup. Had a normal Pap smear in 2013 when she was pregnant. Now having regular cycles with heavy flow for the first day, lasting 5 days total. Has had a tubal ligation for birth control. Has been with her current sexual partner for about a year. Slight vaginal discharge, no color or odor or vaginal itching. No pelvic pain. Needs eye exam. Gets regular dental care.    Review of Systems  Constitutional: Negative for fever, activity change, appetite change and fatigue.  HENT: Negative for dental problem, ear pain, sinus pressure and sore throat.   Respiratory: Negative for cough, chest tightness, shortness of breath and wheezing.   Cardiovascular: Negative for chest pain and leg swelling.  Gastrointestinal: Negative for nausea, vomiting, abdominal pain, diarrhea and constipation.  Genitourinary: Negative for dysuria, urgency, frequency, vaginal discharge, enuresis, difficulty urinating, genital sores, menstrual problem and pelvic pain.       Objective:   Physical Exam  Vitals reviewed. Constitutional: She is oriented to person, place, and time. She appears well-developed. No distress.  HENT:  Right Ear: External ear normal.  Left Ear: External ear normal.  Mouth/Throat: Oropharynx is clear and moist.  Neck: Normal range of motion. Neck supple. No tracheal deviation present. No thyromegaly present.  Cardiovascular: Normal rate, regular rhythm and normal heart sounds.  Exam reveals no gallop.   No murmur heard. Pulmonary/Chest: Effort normal and breath sounds normal.  Abdominal: Soft. She exhibits no distension. There is no tenderness.  Genitourinary: Vagina normal and uterus normal. No vaginal discharge found.  External GU: No lesions or rash. Vagina no discharge. Cervix normal limit in appearance, no CMT. Bimanual exam normal, no obvious masses, no  tenderness.  Musculoskeletal: She exhibits no edema.  Lymphadenopathy:    She has no cervical adenopathy.  Neurological: She is alert and oriented to person, place, and time.  Skin: Skin is warm and dry. No rash noted.  Psychiatric: She has a normal mood and affect. Her behavior is normal.   Breast exam: Slightly dense tissue, no dominant masses; axillae no adenopathy.       Assessment & Plan:  Routine general medical examination at a health care facility - Plan: Hepatic function panel, Lipid panel, Basic metabolic panel, TSH, Vit D  25 hydroxy (rtn osteoporosis monitoring)  Well woman exam - Plan: Pap IG, CT/NG NAA, and HPV (high risk)  Screening for cervical cancer - Plan: Pap IG, CT/NG NAA, and HPV (high risk)  Screen for STD (sexually transmitted disease) - Plan: Pap IG, CT/NG NAA, and HPV (high risk)  encourage healthy diet and regular activity. Discussed safe sex issues. Next physical in one year.

## 2014-01-20 LAB — BASIC METABOLIC PANEL
BUN: 10 mg/dL (ref 6–23)
CALCIUM: 9 mg/dL (ref 8.4–10.5)
CO2: 24 mEq/L (ref 19–32)
Chloride: 107 mEq/L (ref 96–112)
Creat: 0.92 mg/dL (ref 0.50–1.10)
Glucose, Bld: 82 mg/dL (ref 70–99)
POTASSIUM: 4.4 meq/L (ref 3.5–5.3)
SODIUM: 138 meq/L (ref 135–145)

## 2014-01-20 LAB — LIPID PANEL
CHOL/HDL RATIO: 1.8 ratio
Cholesterol: 93 mg/dL (ref 0–200)
HDL: 52 mg/dL (ref >39)
LDL Cholesterol: 34 mg/dL (ref 0–99)
Triglycerides: 34 mg/dL (ref <150)
VLDL: 7 mg/dL (ref 0–40)

## 2014-01-20 LAB — HEPATIC FUNCTION PANEL
ALK PHOS: 46 U/L (ref 39–117)
ALT: 17 U/L (ref 0–35)
AST: 17 U/L (ref 0–37)
Albumin: 4 g/dL (ref 3.5–5.2)
BILIRUBIN DIRECT: 0.4 mg/dL — AB (ref 0.0–0.3)
BILIRUBIN INDIRECT: 1.4 mg/dL — AB (ref 0.2–1.2)
BILIRUBIN TOTAL: 1.8 mg/dL — AB (ref 0.2–1.2)
Total Protein: 6.5 g/dL (ref 6.0–8.3)

## 2014-01-21 LAB — VITAMIN D 25 HYDROXY (VIT D DEFICIENCY, FRACTURES): VIT D 25 HYDROXY: 10 ng/mL — AB (ref 30–89)

## 2014-01-21 LAB — TSH: TSH: 1.451 u[IU]/mL (ref 0.350–4.500)

## 2014-01-24 ENCOUNTER — Other Ambulatory Visit: Payer: Self-pay | Admitting: Nurse Practitioner

## 2014-01-24 DIAGNOSIS — E559 Vitamin D deficiency, unspecified: Secondary | ICD-10-CM

## 2014-01-24 MED ORDER — VITAMIN D (ERGOCALCIFEROL) 1.25 MG (50000 UNIT) PO CAPS: 50000.0000 [IU] | ORAL_CAPSULE | ORAL | Status: DC

## 2014-03-24 ENCOUNTER — Encounter: Payer: Self-pay | Admitting: Family Medicine

## 2014-03-24 ENCOUNTER — Ambulatory Visit (INDEPENDENT_AMBULATORY_CARE_PROVIDER_SITE_OTHER): Payer: Commercial Indemnity | Admitting: Family Medicine

## 2014-03-24 VITALS — BP 130/90 | Ht 69.0 in | Wt 186.0 lb

## 2014-03-24 DIAGNOSIS — M545 Low back pain, unspecified: Secondary | ICD-10-CM

## 2014-03-24 NOTE — Patient Instructions (Signed)
Back Exercises Back exercises help treat and prevent back injuries. The goal of back exercises is to increase the strength of your abdominal and back muscles and the flexibility of your back. These exercises should be started when you no longer have back pain. Back exercises include:  Pelvic Tilt. Lie on your back with your knees bent. Tilt your pelvis until the lower part of your back is against the floor. Hold this position 5 to 10 sec and repeat 5 to 10 times.  Knee to Chest. Pull first 1 knee up against your chest and hold for 20 to 30 seconds, repeat this with the other knee, and then both knees. This may be done with the other leg straight or bent, whichever feels better.  Sit-Ups or Curl-Ups. Bend your knees 90 degrees. Start with tilting your pelvis, and do a partial, slow sit-up, lifting your trunk only 30 to 45 degrees off the floor. Take at least 2 to 3 seconds for each sit-up. Do not do sit-ups with your knees out straight. If partial sit-ups are difficult, simply do the above but with only tightening your abdominal muscles and holding it as directed.  Hip-Lift. Lie on your back with your knees flexed 90 degrees. Push down with your feet and shoulders as you raise your hips a couple inches off the floor; hold for 10 seconds, repeat 5 to 10 times.  Back arches. Lie on your stomach, propping yourself up on bent elbows. Slowly press on your hands, causing an arch in your low back. Repeat 3 to 5 times. Any initial stiffness and discomfort should lessen with repetition over time.  Shoulder-Lifts. Lie face down with arms beside your body. Keep hips and torso pressed to floor as you slowly lift your head and shoulders off the floor. Do not overdo your exercises, especially in the beginning. Exercises may cause you some mild back discomfort which lasts for a few minutes; however, if the pain is more severe, or lasts for more than 15 minutes, do not continue exercises until you see your caregiver.  Improvement with exercise therapy for back problems is slow.  See your caregivers for assistance with developing a proper back exercise program. Document Released: 10/30/2004 Document Revised: 12/15/2011 Document Reviewed: 07/24/2011 [facility] Patient Information 2015 [employer], [employer]. This information is not intended to replace advice given to you by your health care Colsen Modi. Make sure you discuss any questions you have with your health care Levante Simones. Ten minutes three times per wk

## 2014-03-24 NOTE — Progress Notes (Signed)
   Subjective:    Patient ID: Meagan Bell, female    DOB: 08-12-1976, 38 y.o.   MRN: [id number]  Back Pain This is a new problem. Episode onset: 2 weeks ago. The problem occurs intermittently. The problem has been waxing and waning since onset. The pain is present in the lumbar spine. Radiates to: sometimes to her right leg. The symptoms are aggravated by bending (Bent over to pick up her baby on Tuesday, and reactivated the pain.). She has tried NSAIDs (icy hot) for the symptoms.   Pt went to Urgent Care yesterday. She said she is feeling better with the meds that were prescribed. They told her it was a bulging disc.   Some lifting with work   Review of Systems  Musculoskeletal: Positive for back pain.   no change in urinary or bowel habits     Objective:   Physical Exam  Alert no apparent distress. Vitals reviewed. Lungs clear. Heart regular in rhythm. Right lumbar tenderness to deep palpation. Negative to straight leg raise.      Assessment & Plan:  Impression lumbar strain with spasm discussed to start on appropriate meds. So far handling Flexeril well. Plan maintain prednisone and Flexeril. Local measures discussed. Exercise encourage. Hold off on diagnostic modalities rationale discussed. WSL

## 2014-08-07 ENCOUNTER — Encounter: Payer: Self-pay | Admitting: Family Medicine

## 2014-09-04 ENCOUNTER — Other Ambulatory Visit: Payer: Self-pay | Admitting: Family Medicine

## 2014-09-04 DIAGNOSIS — Z1231 Encounter for screening mammogram for malignant neoplasm of breast: Secondary | ICD-10-CM

## 2014-09-18 ENCOUNTER — Ambulatory Visit (HOSPITAL_COMMUNITY): Payer: Commercial Indemnity

## 2014-10-12 ENCOUNTER — Other Ambulatory Visit: Payer: Self-pay | Admitting: Family Medicine

## 2014-10-12 DIAGNOSIS — Z1231 Encounter for screening mammogram for malignant neoplasm of breast: Secondary | ICD-10-CM

## 2014-10-23 ENCOUNTER — Ambulatory Visit (HOSPITAL_COMMUNITY)
Admission: RE | Admit: 2014-10-23 | Discharge: 2014-10-23 | Disposition: A | Discharge status: Home / Self Care | Payer: Commercial Indemnity | Source: Ambulatory Visit | Attending: Family Medicine | Admitting: Family Medicine

## 2014-10-23 DIAGNOSIS — Z1231 Encounter for screening mammogram for malignant neoplasm of breast: Secondary | ICD-10-CM

## 2014-10-23 DIAGNOSIS — R928 Other abnormal and inconclusive findings on diagnostic imaging of breast: Secondary | ICD-10-CM | POA: Diagnosis not present

## 2014-10-26 ENCOUNTER — Other Ambulatory Visit: Payer: Self-pay | Admitting: Family Medicine

## 2014-10-26 DIAGNOSIS — R928 Other abnormal and inconclusive findings on diagnostic imaging of breast: Secondary | ICD-10-CM

## 2014-11-07 ENCOUNTER — Other Ambulatory Visit: Payer: Self-pay | Admitting: Family Medicine

## 2014-11-07 ENCOUNTER — Ambulatory Visit
Admission: RE | Admit: 2014-11-07 | Discharge: 2014-11-07 | Disposition: A | Discharge status: Home / Self Care | Payer: Commercial Indemnity | Source: Ambulatory Visit | Attending: Family Medicine | Admitting: Family Medicine

## 2014-11-07 DIAGNOSIS — R928 Other abnormal and inconclusive findings on diagnostic imaging of breast: Secondary | ICD-10-CM

## 2014-11-21 ENCOUNTER — Encounter: Payer: Self-pay | Admitting: Family Medicine

## 2014-11-21 ENCOUNTER — Ambulatory Visit (INDEPENDENT_AMBULATORY_CARE_PROVIDER_SITE_OTHER): Payer: Commercial Indemnity | Admitting: Family Medicine

## 2014-11-21 VITALS — BP 120/70 | Temp 98.8°F | Ht 69.0 in | Wt 182.5 lb

## 2014-11-21 DIAGNOSIS — J329 Chronic sinusitis, unspecified: Secondary | ICD-10-CM

## 2014-11-21 MED ORDER — AMOXICILLIN-POT CLAVULANATE 875-125 MG PO TABS: 1.0000 | ORAL_TABLET | Freq: Two times a day (BID) | ORAL | Status: AC

## 2014-11-21 NOTE — Progress Notes (Signed)
   Subjective:    Patient ID: Julius BowelsAnissa C Bell, female    DOB: Feb 17, 1976, 39 y.o.   MRN: [id number]  Sinusitis This is a new problem. The current episode started in the past 7 days. The problem is unchanged. There has been no fever. The pain is moderate. Associated symptoms include congestion, coughing, a hoarse voice and a sore throat. (Discharge from eyes) Past treatments include oral decongestants (cough drops, salt water, lemons). The treatment provided no relief.  Patient states that she has no other concerns at this time.   gotsick last Monday or Tuesday  Started getting hoarse, woke up coughing real bad  Lost voice  Sore throat  Cough is productive  No sig headache  theraflu nyquil etc.  Review of Systems  HENT: Positive for congestion, hoarse voice and sore throat.   Respiratory: Positive for cough.        Objective:   Physical Exam  Alert moderate malaise. HEENT moderate nasal congestion frontal tenderness. Pharynx erythematous voice worse lungs clear heart regular in rhythm.      Assessment & Plan:  Impression 10 sinusitis/laryngitis plan antibiotics prescribed. Symptomatic care discussed. Warning signs discussed. WSL

## 2015-02-07 ENCOUNTER — Encounter: Payer: Commercial Indemnity | Admitting: Nurse Practitioner

## 2015-02-26 ENCOUNTER — Ambulatory Visit (INDEPENDENT_AMBULATORY_CARE_PROVIDER_SITE_OTHER): Payer: Commercial Indemnity | Admitting: Nurse Practitioner

## 2015-02-26 VITALS — BP 118/84 | Ht 67.0 in | Wt 181.0 lb

## 2015-02-26 DIAGNOSIS — Z1151 Encounter for screening for human papillomavirus (HPV): Secondary | ICD-10-CM | POA: Diagnosis not present

## 2015-02-26 DIAGNOSIS — Z0000 Encounter for general adult medical examination without abnormal findings: Secondary | ICD-10-CM | POA: Diagnosis not present

## 2015-02-26 DIAGNOSIS — Z01419 Encounter for gynecological examination (general) (routine) without abnormal findings: Secondary | ICD-10-CM

## 2015-02-26 DIAGNOSIS — E559 Vitamin D deficiency, unspecified: Secondary | ICD-10-CM

## 2015-02-26 DIAGNOSIS — Z124 Encounter for screening for malignant neoplasm of cervix: Secondary | ICD-10-CM

## 2015-02-27 LAB — VITAMIN D 25 HYDROXY (VIT D DEFICIENCY, FRACTURES): VIT D 25 HYDROXY: 12.4 ng/mL — AB (ref 30.0–100.0)

## 2015-02-28 LAB — PAP IG AND HPV HIGH-RISK
HPV, high-risk: NEGATIVE
PAP Smear Comment: 0

## 2015-03-01 ENCOUNTER — Other Ambulatory Visit: Payer: Self-pay | Admitting: Nurse Practitioner

## 2015-03-01 MED ORDER — VITAMIN D (ERGOCALCIFEROL) 1.25 MG (50000 UNIT) PO CAPS
50000.0000 [IU] | ORAL_CAPSULE | ORAL | Status: DC
Start: 2015-03-01 — End: 2016-03-12

## 2015-03-03 ENCOUNTER — Encounter: Payer: Self-pay | Admitting: Nurse Practitioner

## 2015-03-03 NOTE — Progress Notes (Signed)
   Subjective:    Patient ID: Julius BowelsAnissa C Tessmer, female    DOB: 1975-10-28, 39 y.o.   MRN: [id number]  HPI presents for her wellness exam. Regular menses, light to heavy flow lasting about 5 days. Same partner. Regular dental exams. Needs vision exam. Diet overall healthy. No regular activity.     Review of Systems  Constitutional: Negative for activity change, appetite change and fatigue.  HENT: Negative for dental problem, ear pain, sinus pressure and sore throat.   Eyes: Negative for visual disturbance.  Respiratory: Negative for cough, chest tightness, shortness of breath and wheezing.   Cardiovascular: Negative for chest pain.  Gastrointestinal: Negative for nausea, vomiting, abdominal pain, diarrhea, constipation and abdominal distention.  Genitourinary: Negative for dysuria, urgency, frequency, vaginal discharge, enuresis, difficulty urinating, genital sores, menstrual problem and pelvic pain.       Objective:   Physical Exam  Constitutional: She is oriented to person, place, and time. She appears well-developed. No distress.  HENT:  Right Ear: External ear normal.  Left Ear: External ear normal.  Mouth/Throat: Oropharynx is clear and moist.  Neck: Normal range of motion. Neck supple. No tracheal deviation present. No thyromegaly present.  Cardiovascular: Normal rate, regular rhythm and normal heart sounds.  Exam reveals no gallop.   No murmur heard. Pulmonary/Chest: Effort normal and breath sounds normal.  Abdominal: Soft. She exhibits no distension. There is no tenderness.  Genitourinary: Vagina normal and uterus normal. No vaginal discharge found.  External GU: no rashes or lesions. Vagina: no discharge. Cervix normal in appearance; no CMT. Bimanual exam: no tenderness or obvious masses.   Musculoskeletal: She exhibits no edema.  Lymphadenopathy:    She has no cervical adenopathy.  Neurological: She is alert and oriented to person, place, and time.  Skin: Skin is warm and  dry. No rash noted.  Psychiatric: She has a normal mood and affect. Her behavior is normal.  Vitals reviewed. Breast exam: areas of density; mild fine nodularity; no dominant masses; axillae no adenopathy.         Assessment & Plan:   Problem List Items Addressed This Visit      Other   Vitamin D deficiency   Relevant Orders   Vit D  25 hydroxy (rtn osteoporosis monitoring) (Completed)   Vit D  25 hydroxy (rtn osteoporosis monitoring)    Other Visit Diagnoses    Well woman exam    -  Primary    Screening for cervical cancer        Relevant Orders    Pap IG and HPV (high risk) DNA detection (Completed)    Screening for HPV (human papillomavirus)        Relevant Orders    Pap IG and HPV (high risk) DNA detection (Completed)      Recommend regular activity and daily vitamin D/calcium supplementation.  Return in about 1 year (around 02/26/2016) for physical.

## 2015-05-28 ENCOUNTER — Encounter: Payer: Self-pay | Admitting: Family Medicine

## 2015-05-28 ENCOUNTER — Ambulatory Visit (INDEPENDENT_AMBULATORY_CARE_PROVIDER_SITE_OTHER): Payer: Commercial Indemnity | Admitting: Family Medicine

## 2015-05-28 VITALS — BP 110/78 | Temp 99.4°F | Ht 67.0 in | Wt 177.4 lb

## 2015-05-28 DIAGNOSIS — J329 Chronic sinusitis, unspecified: Secondary | ICD-10-CM | POA: Diagnosis not present

## 2015-05-28 MED ORDER — AZITHROMYCIN 200 MG/5ML PO SUSR: ORAL | Status: AC

## 2015-05-28 NOTE — Progress Notes (Signed)
   Subjective:    Patient ID: Meagan Bell, female    DOB: 1976/02/16, 39 y.o.   MRN: [id number]  Cough This is a new problem. The current episode started in the past 7 days (3 days). Associated symptoms include a fever, headaches and a sore throat. Treatments tried: nyquil.   Cong and cough and cold  dimhed energy  Some porod   tmax 102  frontal   Review of Systems  Constitutional: Positive for fever.  HENT: Positive for sore throat.   Respiratory: Positive for cough.   Neurological: Positive for headaches.   No vomiting no diarrhea    Objective:   Physical Exam Alert moderate malaise bottles stable HET moderate his congestion pharynx slight erythema neck supple. Lungs intermittent bronchial cough heart regular in rhythm.       Assessment & Plan:  Impression post viral rhinosinusitis/bronchitis plan antibiotics prescribed. Symptom care discussed. Warning signs discussed. WSL

## 2015-11-13 ENCOUNTER — Other Ambulatory Visit: Payer: Self-pay

## 2015-11-13 DIAGNOSIS — Z1231 Encounter for screening mammogram for malignant neoplasm of breast: Secondary | ICD-10-CM

## 2015-12-03 ENCOUNTER — Ambulatory Visit
Admission: RE | Admit: 2015-12-03 | Discharge: 2015-12-03 | Disposition: A | Discharge status: Home / Self Care | Payer: Commercial Indemnity | Source: Ambulatory Visit

## 2015-12-03 DIAGNOSIS — Z1231 Encounter for screening mammogram for malignant neoplasm of breast: Secondary | ICD-10-CM

## 2015-12-05 ENCOUNTER — Other Ambulatory Visit: Payer: Self-pay | Admitting: Family Medicine

## 2015-12-05 DIAGNOSIS — R928 Other abnormal and inconclusive findings on diagnostic imaging of breast: Secondary | ICD-10-CM

## 2015-12-12 ENCOUNTER — Other Ambulatory Visit: Payer: Commercial Indemnity

## 2015-12-19 ENCOUNTER — Ambulatory Visit
Admission: RE | Admit: 2015-12-19 | Discharge: 2015-12-19 | Disposition: A | Discharge status: Home / Self Care | Payer: Managed Care, Other (non HMO) | Source: Ambulatory Visit | Attending: Family Medicine | Admitting: Family Medicine

## 2015-12-19 DIAGNOSIS — R928 Other abnormal and inconclusive findings on diagnostic imaging of breast: Secondary | ICD-10-CM

## 2016-03-04 ENCOUNTER — Encounter: Payer: Commercial Indemnity | Admitting: Nurse Practitioner

## 2016-03-12 ENCOUNTER — Encounter: Payer: Self-pay | Admitting: Nurse Practitioner

## 2016-03-12 ENCOUNTER — Ambulatory Visit (INDEPENDENT_AMBULATORY_CARE_PROVIDER_SITE_OTHER): Payer: Managed Care, Other (non HMO) | Admitting: Nurse Practitioner

## 2016-03-12 VITALS — BP 128/82 | Ht 67.0 in | Wt 183.4 lb

## 2016-03-12 DIAGNOSIS — N926 Irregular menstruation, unspecified: Secondary | ICD-10-CM

## 2016-03-12 DIAGNOSIS — E559 Vitamin D deficiency, unspecified: Secondary | ICD-10-CM | POA: Diagnosis not present

## 2016-03-12 DIAGNOSIS — Z0000 Encounter for general adult medical examination without abnormal findings: Secondary | ICD-10-CM | POA: Diagnosis not present

## 2016-03-12 DIAGNOSIS — Z01419 Encounter for gynecological examination (general) (routine) without abnormal findings: Secondary | ICD-10-CM

## 2016-03-12 MED ORDER — FLUCONAZOLE 150 MG PO TABS: ORAL_TABLET | ORAL | Status: DC

## 2016-03-13 ENCOUNTER — Encounter: Payer: Self-pay | Admitting: Nurse Practitioner

## 2016-03-13 NOTE — Progress Notes (Signed)
   Subjective:    Patient ID: Meagan BowelsAnissa C Rookstool, female    DOB: [birth year], 40 y.o.   MRN: [id number]  HPI presents for her wellness exam. Married, same sexual partner. Slightly irregular cycles, normal flow. Has had BTL. Needs vision exam. Regular dental care. Walking 3-4 miles per day.     Review of Systems  Constitutional: Negative for activity change, appetite change and fatigue.  HENT: Negative for dental problem, ear pain, sinus pressure and sore throat.   Respiratory: Negative for cough, chest tightness, shortness of breath and wheezing.   Cardiovascular: Negative for chest pain.  Gastrointestinal: Negative for nausea, vomiting, abdominal pain, diarrhea, constipation and abdominal distention.  Genitourinary: Positive for menstrual problem. Negative for dysuria, urgency, frequency, vaginal discharge, enuresis, difficulty urinating, genital sores and pelvic pain.       Objective:   Physical Exam  Constitutional: She is oriented to person, place, and time. She appears well-developed. No distress.  HENT:  Right Ear: External ear normal.  Left Ear: External ear normal.  Mouth/Throat: Oropharynx is clear and moist.  Neck: Normal range of motion. Neck supple. No tracheal deviation present. No thyromegaly present.  Cardiovascular: Normal rate, regular rhythm and normal heart sounds.  Exam reveals no gallop.   No murmur heard. Pulmonary/Chest: Effort normal and breath sounds normal.  Abdominal: Soft. She exhibits no distension. There is no tenderness.  Genitourinary: Vagina normal and uterus normal. No vaginal discharge found.  External GU: no rashes or lesions. Vagina: minimal amount of clumpy white discharge. No CMT. Uterus and adnexa no tenderness or obvious masses.   Musculoskeletal: She exhibits no edema.  Lymphadenopathy:    She has no cervical adenopathy.  Neurological: She is alert and oriented to person, place, and time.  Skin: Skin is warm and dry. No rash noted.    Psychiatric: She has a normal mood and affect. Her behavior is normal.  Vitals reviewed. Breast exam: slightly dense tissue; no masses; axillae no adenopathy.         Assessment & Plan:   Problem List Items Addressed This Visit      Other   Vitamin D deficiency   Relevant Orders   VITAMIN D 25 Hydroxy (Vit-D Deficiency, Fractures)    Other Visit Diagnoses    Well woman exam    -  Primary    Relevant Orders    CBC with Differential/Platelet    Lipid panel    Hepatic function panel    Basic metabolic panel    Irregular menses        Relevant Orders    CBC with Differential/Platelet    Basic metabolic panel     Probable vaginal candidiasis  Meds ordered this encounter  Medications  . fluconazole (DIFLUCAN) 150 MG tablet    Sig: One po qd prn yeast infection; may repeat in 3-4 days if needed    Dispense:  2 tablet    Refill:  0    Order Specific Question:  Supervising Provider    Answer:  Merlyn AlbertLUKING, WILLIAM S [[id number]]   Recommend daily vitamin D and calcium. Patient defers hormones to help with cycle. To call back if any prolonged or heavy bleeding.  Return in about 1 year (around 03/12/2017) for physical.

## 2016-06-03 LAB — CBC WITH DIFFERENTIAL/PLATELET
BASOS ABS: 0 10*3/uL (ref 0.0–0.2)
BASOS: 1 %
EOS (ABSOLUTE): 0.2 10*3/uL (ref 0.0–0.4)
Eos: 3 %
Hematocrit: 42.3 % (ref 34.0–46.6)
Hemoglobin: 14.5 g/dL (ref 11.1–15.9)
IMMATURE GRANS (ABS): 0 10*3/uL (ref 0.0–0.1)
Immature Granulocytes: 0 %
LYMPHS ABS: 2.2 10*3/uL (ref 0.7–3.1)
LYMPHS: 28 %
MCH: 30.5 pg (ref 26.6–33.0)
MCHC: 34.3 g/dL (ref 31.5–35.7)
MCV: 89 fL (ref 79–97)
MONOS ABS: 0.6 10*3/uL (ref 0.1–0.9)
Monocytes: 7 %
NEUTROS ABS: 4.7 10*3/uL (ref 1.4–7.0)
Neutrophils: 61 %
PLATELETS: 225 10*3/uL (ref 150–379)
RBC: 4.76 x10E6/uL (ref 3.77–5.28)
RDW: 13.9 % (ref 12.3–15.4)
WBC: 7.7 10*3/uL (ref 3.4–10.8)

## 2016-06-03 LAB — LIPID PANEL
CHOLESTEROL TOTAL: 108 mg/dL (ref 100–199)
Chol/HDL Ratio: 2 ratio units (ref 0.0–4.4)
HDL: 53 mg/dL (ref >39)
LDL Calculated: 46 mg/dL (ref 0–99)
Triglycerides: 43 mg/dL (ref 0–149)
VLDL CHOLESTEROL CAL: 9 mg/dL (ref 5–40)

## 2016-06-03 LAB — BASIC METABOLIC PANEL
BUN / CREAT RATIO: 12 (ref 9–23)
BUN: 12 mg/dL (ref 6–24)
CALCIUM: 9 mg/dL (ref 8.7–10.2)
CHLORIDE: 104 mmol/L (ref 96–106)
CO2: 21 mmol/L (ref 18–29)
CREATININE: 1.04 mg/dL — AB (ref 0.57–1.00)
GFR calc Af Amer: 77 mL/min/{1.73_m2} (ref >59)
GFR calc non Af Amer: 67 mL/min/{1.73_m2} (ref >59)
GLUCOSE: 80 mg/dL (ref 65–99)
Potassium: 4.5 mmol/L (ref 3.5–5.2)
Sodium: 140 mmol/L (ref 134–144)

## 2016-06-03 LAB — HEPATIC FUNCTION PANEL
ALBUMIN: 4.1 g/dL (ref 3.5–5.5)
ALT: 20 IU/L (ref 0–32)
AST: 20 IU/L (ref 0–40)
Alkaline Phosphatase: 46 IU/L (ref 39–117)
Bilirubin Total: 1.7 mg/dL — ABNORMAL HIGH (ref 0.0–1.2)
Bilirubin, Direct: 0.26 mg/dL (ref 0.00–0.40)
TOTAL PROTEIN: 6.3 g/dL (ref 6.0–8.5)

## 2016-06-03 LAB — VITAMIN D 25 HYDROXY (VIT D DEFICIENCY, FRACTURES): VIT D 25 HYDROXY: 21.5 ng/mL — AB (ref 30.0–100.0)

## 2016-06-16 ENCOUNTER — Other Ambulatory Visit: Payer: Self-pay | Admitting: Nurse Practitioner

## 2016-06-16 MED ORDER — VITAMIN D (ERGOCALCIFEROL) 1.25 MG (50000 UNIT) PO CAPS
50000.0000 [IU] | ORAL_CAPSULE | ORAL | 2 refills | Status: DC
Start: 2016-06-16 — End: 2018-04-14

## 2016-11-11 ENCOUNTER — Other Ambulatory Visit: Payer: Self-pay | Admitting: Family Medicine

## 2016-11-11 DIAGNOSIS — Z1231 Encounter for screening mammogram for malignant neoplasm of breast: Secondary | ICD-10-CM

## 2016-12-04 ENCOUNTER — Ambulatory Visit: Payer: Managed Care, Other (non HMO)

## 2016-12-08 ENCOUNTER — Ambulatory Visit: Payer: Managed Care, Other (non HMO)

## 2016-12-24 ENCOUNTER — Ambulatory Visit: Payer: Managed Care, Other (non HMO)

## 2017-01-12 ENCOUNTER — Ambulatory Visit
Admission: RE | Admit: 2017-01-12 | Discharge: 2017-01-12 | Disposition: A | Discharge status: Home / Self Care | Payer: Managed Care, Other (non HMO) | Source: Ambulatory Visit | Attending: Family Medicine | Admitting: Family Medicine

## 2017-01-12 DIAGNOSIS — Z1231 Encounter for screening mammogram for malignant neoplasm of breast: Secondary | ICD-10-CM

## 2017-03-16 ENCOUNTER — Encounter: Payer: Managed Care, Other (non HMO) | Admitting: Nurse Practitioner

## 2017-04-07 ENCOUNTER — Ambulatory Visit (INDEPENDENT_AMBULATORY_CARE_PROVIDER_SITE_OTHER): Payer: Managed Care, Other (non HMO) | Admitting: Nurse Practitioner

## 2017-04-07 ENCOUNTER — Encounter: Payer: Self-pay | Admitting: Nurse Practitioner

## 2017-04-07 VITALS — BP 126/80 | Ht 67.0 in | Wt 189.0 lb

## 2017-04-07 DIAGNOSIS — Z0000 Encounter for general adult medical examination without abnormal findings: Secondary | ICD-10-CM | POA: Diagnosis not present

## 2017-04-07 MED ORDER — FLUCONAZOLE 150 MG PO TABS: ORAL_TABLET | ORAL | 0 refills | Status: DC

## 2017-04-08 ENCOUNTER — Encounter: Payer: Self-pay | Admitting: Nurse Practitioner

## 2017-04-08 NOTE — Progress Notes (Signed)
   Subjective:    Patient ID: Meagan BowelsAnissa C Bell, female    DOB: [birth year], 41 y.o.   MRN: [id number]  HPI presents for her wellness exam. Overall healthy diet. Regular walking program. Regular cycles, normal flow. Same sexual partner. Needs routine eye exam. Regular dental care.     Review of Systems  Constitutional: Negative for activity change, appetite change and fatigue.  HENT: Negative for dental problem, ear pain, sinus pressure and sore throat.   Respiratory: Negative for cough, chest tightness, shortness of breath and wheezing.   Cardiovascular: Negative for chest pain.  Gastrointestinal: Negative for abdominal distention, abdominal pain, constipation, diarrhea, nausea and vomiting.  Genitourinary: Negative for difficulty urinating, dysuria, enuresis, frequency, genital sores, menstrual problem, pelvic pain, urgency and vaginal discharge.       Objective:   Physical Exam  Constitutional: She is oriented to person, place, and time. She appears well-developed. No distress.  HENT:  Right Ear: External ear normal.  Left Ear: External ear normal.  Mouth/Throat: Oropharynx is clear and moist.  Neck: Normal range of motion. Neck supple. No tracheal deviation present. No thyromegaly present.  Cardiovascular: Normal rate, regular rhythm and normal heart sounds.  Exam reveals no gallop.   No murmur heard. Pulmonary/Chest: Effort normal and breath sounds normal.  Abdominal: Soft. She exhibits no distension. There is no tenderness.  Genitourinary: Vagina normal and uterus normal. No vaginal discharge found.  Genitourinary Comments: External GU: no rashes or lesions. Vagina: no discharge. Cervix normal in appearance. No CMT. Bimanual exam: no tenderness or obvious masses.   Musculoskeletal: She exhibits no edema.  Lymphadenopathy:    She has no cervical adenopathy.  Neurological: She is alert and oriented to person, place, and time.  Skin: Skin is warm and dry. No rash noted.    Psychiatric: She has a normal mood and affect. Her behavior is normal.  Vitals reviewed. Breast exam: no masses; axillae no adenopathy.         Assessment & Plan:  Annual physical exam  Continue healthy diet and activity. Also daily vitamin D and calcium. Recommend eye exam this year.  Return in about 1 year (around 04/07/2018) for physical.

## 2017-12-04 ENCOUNTER — Other Ambulatory Visit: Payer: Self-pay | Admitting: Family Medicine

## 2017-12-04 DIAGNOSIS — Z1231 Encounter for screening mammogram for malignant neoplasm of breast: Secondary | ICD-10-CM

## 2018-01-22 ENCOUNTER — Ambulatory Visit
Admission: RE | Admit: 2018-01-22 | Discharge: 2018-01-22 | Disposition: A | Discharge status: Home / Self Care | Payer: Managed Care, Other (non HMO) | Source: Ambulatory Visit | Attending: Family Medicine | Admitting: Family Medicine

## 2018-01-22 DIAGNOSIS — Z1231 Encounter for screening mammogram for malignant neoplasm of breast: Secondary | ICD-10-CM

## 2018-01-25 ENCOUNTER — Ambulatory Visit: Payer: Managed Care, Other (non HMO)

## 2018-04-12 ENCOUNTER — Encounter: Payer: Managed Care, Other (non HMO) | Admitting: Nurse Practitioner

## 2018-04-14 ENCOUNTER — Ambulatory Visit (INDEPENDENT_AMBULATORY_CARE_PROVIDER_SITE_OTHER): Payer: Managed Care, Other (non HMO) | Admitting: Nurse Practitioner

## 2018-04-14 ENCOUNTER — Encounter: Payer: Self-pay | Admitting: Nurse Practitioner

## 2018-04-14 VITALS — BP 116/74 | Ht 67.0 in | Wt 182.4 lb

## 2018-04-14 DIAGNOSIS — Z0000 Encounter for general adult medical examination without abnormal findings: Secondary | ICD-10-CM | POA: Diagnosis not present

## 2018-04-14 DIAGNOSIS — Z01419 Encounter for gynecological examination (general) (routine) without abnormal findings: Secondary | ICD-10-CM

## 2018-04-14 DIAGNOSIS — Z124 Encounter for screening for malignant neoplasm of cervix: Secondary | ICD-10-CM

## 2018-04-14 DIAGNOSIS — Z1151 Encounter for screening for human papillomavirus (HPV): Secondary | ICD-10-CM

## 2018-04-14 DIAGNOSIS — Z113 Encounter for screening for infections with a predominantly sexual mode of transmission: Secondary | ICD-10-CM | POA: Diagnosis not present

## 2018-04-14 DIAGNOSIS — E559 Vitamin D deficiency, unspecified: Secondary | ICD-10-CM

## 2018-04-15 LAB — COMP. METABOLIC PANEL (12)
ALK PHOS: 51 IU/L (ref 39–117)
AST: 18 IU/L (ref 0–40)
Albumin/Globulin Ratio: 1.7 (ref 1.2–2.2)
Albumin: 4.5 g/dL (ref 3.5–5.5)
BUN/Creatinine Ratio: 10 (ref 9–23)
BUN: 10 mg/dL (ref 6–24)
Bilirubin Total: 1.2 mg/dL (ref 0.0–1.2)
Calcium: 9.3 mg/dL (ref 8.7–10.2)
Chloride: 105 mmol/L (ref 96–106)
Creatinine, Ser: 0.97 mg/dL (ref 0.57–1.00)
GFR calc Af Amer: 83 mL/min/{1.73_m2} (ref >59)
GFR, EST NON AFRICAN AMERICAN: 72 mL/min/{1.73_m2} (ref >59)
GLOBULIN, TOTAL: 2.6 g/dL (ref 1.5–4.5)
GLUCOSE: 81 mg/dL (ref 65–99)
POTASSIUM: 4.6 mmol/L (ref 3.5–5.2)
Sodium: 139 mmol/L (ref 134–144)
Total Protein: 7.1 g/dL (ref 6.0–8.5)

## 2018-04-15 LAB — LIPID PANEL
CHOL/HDL RATIO: 2.1 ratio (ref 0.0–4.4)
CHOLESTEROL TOTAL: 110 mg/dL (ref 100–199)
HDL: 53 mg/dL (ref >39)
LDL Calculated: 47 mg/dL (ref 0–99)
TRIGLYCERIDES: 52 mg/dL (ref 0–149)
VLDL Cholesterol Cal: 10 mg/dL (ref 5–40)

## 2018-04-15 LAB — HEPATITIS C ANTIBODY

## 2018-04-15 LAB — RPR: RPR Ser Ql: NONREACTIVE

## 2018-04-15 LAB — HIV ANTIBODY (ROUTINE TESTING W REFLEX): HIV Screen 4th Generation wRfx: NONREACTIVE

## 2018-04-15 LAB — VITAMIN D 25 HYDROXY (VIT D DEFICIENCY, FRACTURES): Vit D, 25-Hydroxy: 17.2 ng/mL — ABNORMAL LOW (ref 30.0–100.0)

## 2018-04-17 ENCOUNTER — Encounter: Payer: Self-pay | Admitting: Nurse Practitioner

## 2018-04-17 LAB — PAP IG, CT-NG NAA, HPV HIGH-RISK
CHLAMYDIA, NUC. ACID AMP: NEGATIVE
GONOCOCCUS BY NUCLEIC ACID AMP: NEGATIVE
HPV, HIGH-RISK: NEGATIVE
PAP Smear Comment: 0

## 2018-04-17 NOTE — Progress Notes (Signed)
Subjective:    Patient ID: Meagan Bell, female    DOB: 12/04/1975, 42 y.o.   MRN: [id number]  HPI Presents for her wellness exam. Has had BTL. Regular cycles, normal flow. Has a new sexual partner. Needs eye exam. Regular dental exam. Regular walking     Review of Systems  Constitutional: Negative for activity change, appetite change and fatigue.  HENT: Negative for dental problem, ear pain, sinus pressure and sore throat.   Respiratory: Negative for cough, chest tightness, shortness of breath and wheezing.   Cardiovascular: Negative for chest pain.  Gastrointestinal: Negative for abdominal distention, abdominal pain, constipation, diarrhea, nausea and vomiting.  Genitourinary: Negative for difficulty urinating, dysuria, enuresis, frequency, genital sores, menstrual problem, pelvic pain, urgency and vaginal discharge.   [facility] 04/14/2018 04/07/2017  Decreased Interest 0 0  Down, Depressed, Hopeless 0 0  PHQ - 2 Score 0 0        Objective:   Physical Exam  Constitutional: She is oriented to person, place, and time. She appears well-developed. No distress.  HENT:  Right Ear: External ear normal.  Left Ear: External ear normal.  Mouth/Throat: Oropharynx is clear and moist.  Neck: Normal range of motion. Neck supple. No tracheal deviation present. No thyromegaly present.  Cardiovascular: Normal rate, regular rhythm and normal heart sounds. Exam reveals no gallop.  No murmur heard. Pulmonary/Chest: Effort normal and breath sounds normal. Right breast exhibits no inverted nipple, no mass, no skin change and no tenderness. Left breast exhibits no inverted nipple, no mass, no skin change and no tenderness. Breasts are symmetrical.  Axillae no adenopathy  Abdominal: Soft. She exhibits no distension. There is no tenderness.  Genitourinary: Vagina normal and uterus normal. No vaginal discharge found.  Genitourinary Comments: External GU: No rashes or lesions.  Vagina  no discharge.  Cervix normal limit in appearance.  No CMT.  Bimanual exam no tenderness or obvious masses.  Musculoskeletal: She exhibits no edema.  Lymphadenopathy:    She has no cervical adenopathy.  Neurological: She is alert and oriented to person, place, and time.  Skin: Skin is warm and dry. No rash noted.  Psychiatric: She has a normal mood and affect. Her behavior is normal.          Assessment & Plan:   Problem List Items Addressed This Visit      Other   Vitamin D deficiency   Relevant Orders   VITAMIN D 25 Hydroxy (Vit-D Deficiency, Fractures) (Completed)    Other Visit Diagnoses    Well woman exam    -  Primary   Relevant Orders   Pap IG, CT/NG NAA, and HPV (high risk) (Completed)   Comp. Metabolic Panel (12) (Completed)   Lipid panel (Completed)   Screening for cervical cancer       Relevant Orders   Pap IG, CT/NG NAA, and HPV (high risk) (Completed)   Screening for HPV (human papillomavirus)       Relevant Orders   Pap IG, CT/NG NAA, and HPV (high risk) (Completed)   Screen for STD (sexually transmitted disease)       Relevant Orders   Pap IG, CT/NG NAA, and HPV (high risk) (Completed)   HIV antibody (Completed)   RPR (Completed)   Hepatitis C Antibody (Completed)     Encouraged continued activity healthy diet and weight loss efforts.  Recommend daily vitamin D supplement.  STD screening, labs and Pap smear results pending. Return in about 1 year (  around 04/15/2019) for physical.

## 2018-04-22 ENCOUNTER — Other Ambulatory Visit: Payer: Self-pay | Admitting: Nurse Practitioner

## 2018-04-22 MED ORDER — VITAMIN D (ERGOCALCIFEROL) 1.25 MG (50000 UNIT) PO CAPS: 50000.0000 [IU] | ORAL_CAPSULE | ORAL | 5 refills | Status: DC

## 2018-07-05 ENCOUNTER — Telehealth: Payer: Self-pay | Admitting: *Deleted

## 2018-07-05 NOTE — Telephone Encounter (Signed)
Patient called stating that she started with sudden onset back pain and stomach pain yesterday afternoon. Patient stated when she got up this morning she was hardly able to walk due to the severity of the pain. Advised patient to go to the urgent care or ER for evaluation or treatment. Patient verbalized understanding.

## 2018-07-05 NOTE — Telephone Encounter (Signed)
agree

## 2018-11-24 ENCOUNTER — Other Ambulatory Visit: Payer: Self-pay | Admitting: Family Medicine

## 2018-11-24 DIAGNOSIS — Z1231 Encounter for screening mammogram for malignant neoplasm of breast: Secondary | ICD-10-CM

## 2019-01-24 ENCOUNTER — Ambulatory Visit: Payer: Managed Care, Other (non HMO)

## 2019-03-22 ENCOUNTER — Ambulatory Visit
Admission: RE | Admit: 2019-03-22 | Discharge: 2019-03-22 | Disposition: A | Discharge status: Home / Self Care | Payer: Managed Care, Other (non HMO) | Source: Ambulatory Visit | Attending: Family Medicine | Admitting: Family Medicine

## 2019-03-22 ENCOUNTER — Other Ambulatory Visit: Payer: Self-pay

## 2019-03-22 DIAGNOSIS — Z1231 Encounter for screening mammogram for malignant neoplasm of breast: Secondary | ICD-10-CM

## 2019-04-20 ENCOUNTER — Other Ambulatory Visit: Payer: Self-pay

## 2019-04-20 ENCOUNTER — Ambulatory Visit (INDEPENDENT_AMBULATORY_CARE_PROVIDER_SITE_OTHER): Payer: Managed Care, Other (non HMO) | Admitting: Nurse Practitioner

## 2019-04-20 VITALS — BP 110/80 | Ht 68.5 in | Wt 183.0 lb

## 2019-04-20 DIAGNOSIS — Z01419 Encounter for gynecological examination (general) (routine) without abnormal findings: Secondary | ICD-10-CM | POA: Diagnosis not present

## 2019-04-20 DIAGNOSIS — Z0000 Encounter for general adult medical examination without abnormal findings: Secondary | ICD-10-CM

## 2019-04-20 NOTE — Progress Notes (Signed)
Subjective:    Patient ID: Meagan Bell, female    DOB: 06/30/1976, 43 y.o.   MRN: [id number]  HPI The patient comes in today for a wellness visit.    A review of their health history was completed.  A review of medications was also completed.  Any needed refills; none  Eating habits: health conscious  Falls/  MVA accidents in past few months: none  Regular exercise: walking   Specialist pt sees on regular basis: none  Preventative health issues were discussed.   Additional concerns: none   Presents for her preventive health physical. Same sexual partner. Mostly regular cycles, normal flow lasting 5-6 days. Having significant pelvic pain mid cycle. Does not occur at other times. Needs eye exam. Regular dental exams.    Review of Systems  Constitutional: Negative for activity change, appetite change, fatigue and fever.  HENT: Negative for dental problem, ear pain, sinus pressure and sore throat.   Respiratory: Negative for cough, chest tightness, shortness of breath and wheezing.   Cardiovascular: Negative for chest pain.  Gastrointestinal: Negative for abdominal distention, abdominal pain, blood in stool, constipation, diarrhea, nausea and vomiting.  Genitourinary: Positive for pelvic pain. Negative for difficulty urinating, dysuria, enuresis, frequency, genital sores, menstrual problem, urgency and vaginal discharge.       Mid cycle pelvic pain   Depression screen [facility] 2/9 04/20/2019 04/14/2018 04/07/2017  Decreased Interest 0 0 0  Down, Depressed, Hopeless 0 0 0  PHQ - 2 Score 0 0 0        Objective:   Physical Exam Constitutional:      General: She is not in acute distress.    Appearance: Normal appearance. She is well-developed.  Neck:     Musculoskeletal: Normal range of motion and neck supple.     Thyroid: No thyromegaly.     Trachea: No tracheal deviation.     Comments: Thyroid non tender; no mass or goiter noted.  Cardiovascular:     Rate and Rhythm:  Normal rate and regular rhythm.     Heart sounds: Normal heart sounds. No murmur. No gallop.   Pulmonary:     Effort: Pulmonary effort is normal.     Breath sounds: Normal breath sounds.  Chest:     Breasts:        Right: Normal. No mass, skin change or tenderness.        Left: Normal. No mass, skin change or tenderness.  Abdominal:     General: There is no distension.     Palpations: Abdomen is soft.     Tenderness: There is no abdominal tenderness.  Genitourinary:    Vagina: Normal. No vaginal discharge.     Comments: External GU: no rash or lesions noted. Vagina: no discharge. Cervix normal in appearance. No CMT. Bimanual exam: no tenderness or obvious masses.  Lymphadenopathy:     Cervical: No cervical adenopathy.     Upper Body:     Right upper body: No supraclavicular, axillary or pectoral adenopathy.     Left upper body: No supraclavicular, axillary or pectoral adenopathy.  Skin:    General: Skin is warm and dry.     Findings: No rash.  Neurological:     Mental Status: She is alert and oriented to person, place, and time.  Psychiatric:        Mood and Affect: Mood normal.        Behavior: Behavior normal.        Thought Content:  Thought content normal.        Judgment: Judgment normal.           Assessment & Plan:   Problem List Items Addressed This Visit    None    Visit Diagnoses    Well woman exam    -  Primary     Continue activity and healthy diet.  Defers STI testing.  Routine labs with next PE.  Return in about 1 year (around 04/19/2020) for physical.

## 2019-04-21 ENCOUNTER — Encounter: Payer: Self-pay | Admitting: Nurse Practitioner

## 2019-11-07 ENCOUNTER — Encounter: Payer: Self-pay | Admitting: Family Medicine

## 2020-02-16 ENCOUNTER — Other Ambulatory Visit: Payer: Self-pay | Admitting: Nurse Practitioner

## 2020-02-16 ENCOUNTER — Other Ambulatory Visit: Payer: Self-pay | Admitting: Family Medicine

## 2020-02-16 DIAGNOSIS — Z1231 Encounter for screening mammogram for malignant neoplasm of breast: Secondary | ICD-10-CM

## 2020-03-22 ENCOUNTER — Ambulatory Visit
Admission: RE | Admit: 2020-03-22 | Discharge: 2020-03-22 | Disposition: A | Discharge status: Home / Self Care | Payer: Managed Care, Other (non HMO) | Source: Ambulatory Visit | Attending: Nurse Practitioner | Admitting: Nurse Practitioner

## 2020-03-22 ENCOUNTER — Other Ambulatory Visit: Payer: Self-pay

## 2020-03-22 DIAGNOSIS — Z1231 Encounter for screening mammogram for malignant neoplasm of breast: Secondary | ICD-10-CM

## 2020-04-20 ENCOUNTER — Encounter: Payer: Self-pay | Admitting: Nurse Practitioner

## 2020-04-20 ENCOUNTER — Ambulatory Visit (INDEPENDENT_AMBULATORY_CARE_PROVIDER_SITE_OTHER): Payer: Managed Care, Other (non HMO) | Admitting: Nurse Practitioner

## 2020-04-20 ENCOUNTER — Other Ambulatory Visit: Payer: Self-pay

## 2020-04-20 VITALS — BP 128/82 | Temp 98.0°F | Ht 68.5 in | Wt 191.0 lb

## 2020-04-20 DIAGNOSIS — Z130 Encounter for screening for diseases of the blood and blood-forming organs and certain disorders involving the immune mechanism: Secondary | ICD-10-CM

## 2020-04-20 DIAGNOSIS — E559 Vitamin D deficiency, unspecified: Secondary | ICD-10-CM | POA: Diagnosis not present

## 2020-04-20 DIAGNOSIS — Z0000 Encounter for general adult medical examination without abnormal findings: Secondary | ICD-10-CM | POA: Diagnosis not present

## 2020-04-20 DIAGNOSIS — Z131 Encounter for screening for diabetes mellitus: Secondary | ICD-10-CM | POA: Diagnosis not present

## 2020-04-20 DIAGNOSIS — Z13220 Encounter for screening for lipoid disorders: Secondary | ICD-10-CM | POA: Diagnosis not present

## 2020-04-20 NOTE — Progress Notes (Signed)
° °  Subjective:    Patient ID: Meagan Bell, female    DOB: Feb 06, 1976, 44 y.o.   MRN: [id number]  HPI  The patient comes in today for a wellness visit.    A review of their health history was completed.  A review of medications was also completed.  Any needed refills; no  Eating habits: good  Falls/  MVA accidents in past few months: none  Regular exercise: walking  Specialist pt sees on regular basis: none  Preventative health issues were discussed.   Additional concerns: none   Review of Systems     Objective:   Physical Exam        Assessment & Plan:

## 2020-04-20 NOTE — Progress Notes (Signed)
Subjective:    Patient ID: Meagan Bell, female    DOB: 1976/01/29, 44 y.o.   MRN: [id number]  HPI presents for annual exam  No problems to report.   Lives with 3 children, ages 42, 44, 45.   Works as Contractor at [employer].   Exercise:  Walks around track several times, 2 x week.  Diet:  Tries to eat healthy, does eat fast food sometimes, drinks sweet tea, soda, and water.  LMP 04-02-2020, lasts 4-5 days, not heavy or painful.  Sexual history:  Has not had sex in over 1 year.    Has been off Vid D medication for 6 months.    Has not received COVID vaccine.   Denies family history of breast or colon cancer.   Review of Systems  Constitutional: Negative for activity change, appetite change, fatigue and fever.  Eyes:       Denies having eye exam.   Respiratory: Negative for cough, chest tightness, shortness of breath and wheezing.   Cardiovascular: Negative for chest pain, palpitations and leg swelling.  Gastrointestinal: Negative for abdominal pain, blood in stool, constipation, diarrhea, nausea and vomiting.  Genitourinary: Positive for vaginal discharge. Negative for difficulty urinating, dysuria, enuresis, frequency, genital sores, menstrual problem, pelvic pain, urgency and vaginal pain.       White vaginal discharge, nonodorous.  Denies vaginal itching, burning, pelvic pain.    Psychiatric/Behavioral: Negative for self-injury and suicidal ideas.   Depression screen [facility] 2/9 04/20/2020 04/20/2019 04/14/2018 04/07/2017  Decreased Interest 0 0 0 0  Down, Depressed, Hopeless 0 0 0 0  PHQ - 2 Score 0 0 0 0      Objective:   Physical Exam Vitals reviewed.  Constitutional:      Appearance: Normal appearance.  Neck:     Comments: Thyroid non tender. No mass or goiter noted.  Cardiovascular:     Rate and Rhythm: Normal rate and regular rhythm.     Pulses: Normal pulses.     Heart sounds: No murmur heard.  No gallop.   Pulmonary:     Effort: Pulmonary effort is normal.     Breath  sounds: Normal breath sounds.  Chest:     Breasts:        Right: No inverted nipple, mass, skin change or tenderness.        Left: No inverted nipple, mass, skin change or tenderness.  Abdominal:     General: There is no distension.     Palpations: Abdomen is soft. There is no mass.     Tenderness: There is no abdominal tenderness.  Genitourinary:    Vagina: Vaginal discharge present.     Comments: External GU exam: no rashes or lesions.  Vagina has small amount clear to white mucoid discharge Cervix normal in appearance, no CMT Bimanual exam: no tenderness or obvious masses.   Musculoskeletal:     Cervical back: Normal range of motion.  Lymphadenopathy:     Cervical: No cervical adenopathy.     Upper Body:     Right upper body: No supraclavicular, axillary or pectoral adenopathy.     Left upper body: No supraclavicular, axillary or pectoral adenopathy.  Skin:    General: Skin is warm and dry.  Neurological:     Mental Status: She is alert and oriented to person, place, and time.  Psychiatric:        Mood and Affect: Mood normal.        Behavior: Behavior normal.  Thought Content: Thought content normal.        Judgment: Judgment normal.    Today's Vitals   04/20/20 1515  BP: 128/82  Temp: 98 F (36.7 C)  TempSrc: Oral  Weight: 86.6 kg  Height: 5' 8.5" (1.74 m)   Body mass index is 28.62 kg/m.         Assessment & Plan:   Problem List Items Addressed This Visit      Other   Vitamin D deficiency   Relevant Orders   VITAMIN D 25 Hydroxy (Vit-D Deficiency, Fractures)    Other Visit Diagnoses    Annual physical exam    -  Primary   Screening, lipid       Relevant Orders   Lipid panel   Encounter for screening examination for impaired glucose regulation and diabetes mellitus       Relevant Orders   Comprehensive metabolic panel   Screening for iron deficiency anemia       Relevant Orders   CBC with Differential/Platelet     Advised lab orders  have been placed and pt will need to fast 8-10 hours prior to lab draw.   Discussed need to schedule eye exam.   Continue with healthy diet and exercise regimen.       Return in about 1 year (around 04/20/2021) for annual exam.

## 2020-04-23 ENCOUNTER — Encounter: Payer: Self-pay | Admitting: Nurse Practitioner

## 2020-05-19 LAB — COMPREHENSIVE METABOLIC PANEL
ALT: 19 IU/L (ref 0–32)
AST: 17 IU/L (ref 0–40)
Albumin/Globulin Ratio: 1.8 (ref 1.2–2.2)
Albumin: 4.2 g/dL (ref 3.8–4.8)
Alkaline Phosphatase: 48 IU/L (ref 48–121)
BUN/Creatinine Ratio: 14 (ref 9–23)
BUN: 13 mg/dL (ref 6–24)
Bilirubin Total: 1.1 mg/dL (ref 0.0–1.2)
CO2: 21 mmol/L (ref 20–29)
Calcium: 8.8 mg/dL (ref 8.7–10.2)
Chloride: 109 mmol/L — ABNORMAL HIGH (ref 96–106)
Creatinine, Ser: 0.9 mg/dL (ref 0.57–1.00)
GFR calc Af Amer: 90 mL/min/{1.73_m2} (ref >59)
GFR calc non Af Amer: 78 mL/min/{1.73_m2} (ref >59)
Globulin, Total: 2.3 g/dL (ref 1.5–4.5)
Glucose: 84 mg/dL (ref 65–99)
Potassium: 4.1 mmol/L (ref 3.5–5.2)
Sodium: 143 mmol/L (ref 134–144)
Total Protein: 6.5 g/dL (ref 6.0–8.5)

## 2020-05-19 LAB — LIPID PANEL
Chol/HDL Ratio: 2.1 ratio (ref 0.0–4.4)
Cholesterol, Total: 111 mg/dL (ref 100–199)
HDL: 54 mg/dL (ref >39)
LDL Chol Calc (NIH): 46 mg/dL (ref 0–99)
Triglycerides: 40 mg/dL (ref 0–149)
VLDL Cholesterol Cal: 11 mg/dL (ref 5–40)

## 2020-05-19 LAB — CBC WITH DIFFERENTIAL/PLATELET
Basophils Absolute: 0.1 10*3/uL (ref 0.0–0.2)
Basos: 1 %
EOS (ABSOLUTE): 0.2 10*3/uL (ref 0.0–0.4)
Eos: 2 %
Hematocrit: 41.5 % (ref 34.0–46.6)
Hemoglobin: 14.3 g/dL (ref 11.1–15.9)
Immature Grans (Abs): 0.1 10*3/uL (ref 0.0–0.1)
Immature Granulocytes: 1 %
Lymphocytes Absolute: 2.4 10*3/uL (ref 0.7–3.1)
Lymphs: 28 %
MCH: 31.6 pg (ref 26.6–33.0)
MCHC: 34.5 g/dL (ref 31.5–35.7)
MCV: 92 fL (ref 79–97)
Monocytes Absolute: 0.6 10*3/uL (ref 0.1–0.9)
Monocytes: 6 %
Neutrophils Absolute: 5.4 10*3/uL (ref 1.4–7.0)
Neutrophils: 62 %
Platelets: 243 10*3/uL (ref 150–450)
RBC: 4.52 x10E6/uL (ref 3.77–5.28)
RDW: 12.7 % (ref 11.7–15.4)
WBC: 8.6 10*3/uL (ref 3.4–10.8)

## 2020-05-19 LAB — VITAMIN D 25 HYDROXY (VIT D DEFICIENCY, FRACTURES): Vit D, 25-Hydroxy: 15.5 ng/mL — ABNORMAL LOW (ref 30.0–100.0)

## 2020-05-21 ENCOUNTER — Other Ambulatory Visit: Payer: Self-pay | Admitting: Nurse Practitioner

## 2020-05-21 MED ORDER — VITAMIN D (ERGOCALCIFEROL) 1.25 MG (50000 UNIT) PO CAPS: 50000.0000 [IU] | ORAL_CAPSULE | ORAL | 2 refills | Status: DC

## 2020-06-01 ENCOUNTER — Encounter: Payer: Self-pay | Admitting: Family Medicine

## 2020-06-01 ENCOUNTER — Telehealth (INDEPENDENT_AMBULATORY_CARE_PROVIDER_SITE_OTHER): Payer: Managed Care, Other (non HMO) | Admitting: Family Medicine

## 2020-06-01 DIAGNOSIS — U071 COVID-19: Secondary | ICD-10-CM | POA: Diagnosis not present

## 2020-06-01 MED ORDER — AMOXICILLIN 500 MG PO CAPS
500.0000 mg | ORAL_CAPSULE | Freq: Three times a day (TID) | ORAL | 0 refills | Status: AC
Start: 2020-06-01 — End: 2020-06-08

## 2020-06-01 NOTE — Progress Notes (Addendum)
   Subjective:    Patient ID: Meagan Bell, female    DOB: September 12, 1976, 44 y.o.   MRN: [id number] I connected with  Meagan Bell on 06/25/20 by a video enabled telemedicine application and verified that I am speaking with the correct person using two identifiers.   I discussed the limitations of evaluation and management by telemedicine. The patient expressed understanding and agreed to proceed.  Virtual Visit via Video Note  I connected with Meagan Bell on 06/25/20 at  2:00 PM EDT by a video enabled telemedicine application and verified that I am speaking with the correct person using two identifiers.  Location: Patient: Home Provider: Office   I discussed the limitations of evaluation and management by telemedicine and the availability of in person appointments. The patient expressed understanding and agreed to proceed.  History of Present Illness:    Observations/Objective:   Assessment and Plan:   Follow Up Instructions:    I discussed the assessment and treatment plan with the patient. The patient was provided an opportunity to ask questions and all were answered. The patient agreed with the plan and demonstrated an understanding of the instructions.   The patient was advised to call back or seek an in-person evaluation if the symptoms worsen or if the condition fails to improve as anticipated.  I provided 20 minutes of non-face-to-face time during this encounter.  Video visit [name], MD   HPI Patient calls in today for a covid follow up. Tested positive last Thursday. Has had SOB, fever, low energy and no appetite but all seems to be getting better other than the cough and little appetite.  Taking Robitussin and benadryl.  Patient states shortness of breath is not severe it seems to be getting somewhat better denies any vomiting severe chest pain numbness weakness.  No passing out spells  Review of Systems    Please see above Objective:   Physical  Exam Patient had virtual visit Appears to be in no distress Atraumatic Neuro able to relate and oriented No apparent resp distress Color normal    Patient needs to stay out at least 10 days warning signs were discussed regarding when to go to the hospital Assessment & Plan:  Covid infection It is possible there could be some secondary sinus Antibiotic sent in Warning signs discussed Patient does not need to go to the hospital Follow-up if progressive troubles or worse

## 2021-02-19 ENCOUNTER — Other Ambulatory Visit: Payer: Self-pay | Admitting: Nurse Practitioner

## 2021-02-19 DIAGNOSIS — Z1231 Encounter for screening mammogram for malignant neoplasm of breast: Secondary | ICD-10-CM

## 2021-04-01 ENCOUNTER — Telehealth: Payer: Self-pay | Admitting: Family Medicine

## 2021-04-01 DIAGNOSIS — Z13220 Encounter for screening for lipoid disorders: Secondary | ICD-10-CM

## 2021-04-01 DIAGNOSIS — Z0000 Encounter for general adult medical examination without abnormal findings: Secondary | ICD-10-CM

## 2021-04-01 DIAGNOSIS — Z1329 Encounter for screening for other suspected endocrine disorder: Secondary | ICD-10-CM

## 2021-04-01 DIAGNOSIS — Z79899 Other long term (current) drug therapy: Secondary | ICD-10-CM

## 2021-04-01 NOTE — Telephone Encounter (Signed)
Last labs completed 05/18/20 VIT D, CMP, CBC and LIPID. Please advise. Thank you

## 2021-04-01 NOTE — Telephone Encounter (Signed)
Patient needing labs for physical on 7/20

## 2021-04-04 NOTE — Telephone Encounter (Signed)
Lab orders placed and pt is aware 

## 2021-04-17 ENCOUNTER — Other Ambulatory Visit: Payer: Self-pay

## 2021-04-17 ENCOUNTER — Ambulatory Visit
Admission: RE | Admit: 2021-04-17 | Discharge: 2021-04-17 | Disposition: A | Discharge status: Home / Self Care | Payer: Managed Care, Other (non HMO) | Source: Ambulatory Visit | Attending: Nurse Practitioner | Admitting: Nurse Practitioner

## 2021-04-17 DIAGNOSIS — Z1231 Encounter for screening mammogram for malignant neoplasm of breast: Secondary | ICD-10-CM

## 2021-04-24 ENCOUNTER — Encounter: Payer: Managed Care, Other (non HMO) | Admitting: Nurse Practitioner

## 2021-06-07 ENCOUNTER — Other Ambulatory Visit: Payer: Self-pay

## 2021-06-07 ENCOUNTER — Encounter: Payer: Self-pay | Admitting: Nurse Practitioner

## 2021-06-07 ENCOUNTER — Ambulatory Visit (INDEPENDENT_AMBULATORY_CARE_PROVIDER_SITE_OTHER): Payer: Managed Care, Other (non HMO) | Admitting: Nurse Practitioner

## 2021-06-07 VITALS — BP 133/90 | HR 86 | Temp 97.5°F | Ht 68.5 in | Wt 184.0 lb

## 2021-06-07 DIAGNOSIS — Z1151 Encounter for screening for human papillomavirus (HPV): Secondary | ICD-10-CM

## 2021-06-07 DIAGNOSIS — Z124 Encounter for screening for malignant neoplasm of cervix: Secondary | ICD-10-CM

## 2021-06-07 DIAGNOSIS — Z1211 Encounter for screening for malignant neoplasm of colon: Secondary | ICD-10-CM | POA: Diagnosis not present

## 2021-06-07 DIAGNOSIS — Z01411 Encounter for gynecological examination (general) (routine) with abnormal findings: Secondary | ICD-10-CM | POA: Diagnosis not present

## 2021-06-07 DIAGNOSIS — N760 Acute vaginitis: Secondary | ICD-10-CM

## 2021-06-07 DIAGNOSIS — B9689 Other specified bacterial agents as the cause of diseases classified elsewhere: Secondary | ICD-10-CM

## 2021-06-07 DIAGNOSIS — Z01419 Encounter for gynecological examination (general) (routine) without abnormal findings: Secondary | ICD-10-CM

## 2021-06-07 MED ORDER — METRONIDAZOLE 0.75 % VA GEL: 1.0000 | Freq: Every day | VAGINAL | 0 refills | Status: AC

## 2021-06-07 NOTE — Progress Notes (Signed)
Subjective:    Patient ID: Meagan Bell, female    DOB: Aug 18, 1976, 45 y.o.   MRN: [id number]  HPI Patient presents for physical/pap today. She denies any accidents or recent falls within the last 6 months. Her last mammogram was July 2022 and was normal. She feels her health is excellent and eating habits are healthy. Her eye and dental exam are UTD. She denies any headaches, dizziness, or blurred vision. She denies use of alcohol and tobacco.  She has had Covid and both Covid vaccines.  She refuses the Flu vaccine. She enjoys walking daily. Her cycles are regular but somewhat heavy. She denies any vaginal pain, bleeding, discharge or breast issues. She is sexually active with same partner. She has no concerns or issues at this time.    Review of Systems  Constitutional:  Negative for chills, fatigue and fever.  HENT:  Negative for sore throat and trouble swallowing.   Respiratory:  Negative for cough, choking, chest tightness, shortness of breath and wheezing.   Cardiovascular:  Negative for chest pain.  Gastrointestinal:  Negative for abdominal pain, blood in stool, constipation, diarrhea, nausea and vomiting.  Genitourinary:  Negative for difficulty urinating, dysuria, enuresis, frequency, genital sores, menstrual problem, pelvic pain, urgency and vaginal discharge.  Neurological:  Negative for dizziness, tremors, weakness, light-headedness, numbness and headaches.  Depression screen PHQ 2/9 06/07/2021  Decreased Interest 0  Down, Depressed, Hopeless 0  PHQ - 2 Score 0       Objective:   Physical Exam Vitals and nursing note reviewed. Exam conducted with a chaperone present.  Constitutional:      General: She is not in acute distress.    Appearance: Normal appearance.  Neck:     Comments: Thyroid nontender. No mass or goiter noted.  Cardiovascular:     Rate and Rhythm: Normal rate and regular rhythm.     Pulses: Normal pulses.     Heart sounds: Normal heart sounds. No murmur  heard. Pulmonary:     Effort: Pulmonary effort is normal.     Breath sounds: Normal breath sounds.  Chest:  Breasts:    Right: Normal. No swelling, bleeding, inverted nipple, mass, skin change or tenderness.     Left: Normal. No swelling, bleeding, inverted nipple, mass, skin change or tenderness.  Abdominal:     General: There is no distension.     Palpations: Abdomen is soft. There is no mass.     Tenderness: There is no abdominal tenderness.  Genitourinary:    General: Normal vulva.     Exam position: Lithotomy position.     Labia:        Right: No rash, tenderness or lesion.        Left: No rash, tenderness or lesion.      Comments: External genitalia: no lesions or eruptions noted. Vagina and cervix show no lesions, inflammation, or tenderness. Thin white mucoid discharge noted inside vagina. Bimanual exam: no tenderness or masses noted.  Musculoskeletal:     Cervical back: Neck supple.  Lymphadenopathy:     Upper Body:     Right upper body: No supraclavicular, axillary or pectoral adenopathy.     Left upper body: No supraclavicular, axillary or pectoral adenopathy.  Skin:    General: Skin is warm and dry.  Neurological:     Mental Status: She is alert and oriented to person, place, and time.  Psychiatric:        Mood and Affect: Mood normal.  Behavior: Behavior normal.        Thought Content: Thought content normal.        Judgment: Judgment normal.    .. Vitals:   06/07/21 1439  BP: 133/90  Pulse: 86  Temp: (!) 97.5 F (36.4 C)  Height: 5' 8.5" (1.74 m)  Weight: 184 lb (83.5 kg)  SpO2: 99%  BMI (Calculated): 27.57    Results for orders placed or performed in visit on 06/07/21  POCT Wet Prep with KOH  Result Value Ref Range   Trichomonas, UA Negative    Clue Cells Wet Prep HPF POC occas    Epithelial Wet Prep HPF POC     Yeast Wet Prep HPF POC neg    Bacteria Wet Prep HPF POC None (A) Few   RBC Wet Prep HPF POC none    WBC Wet Prep HPF POC none     KOH Prep POC Negative Negative   pH, Wet Prep 6.0         Assessment & Plan:   Problem List Items Addressed This Visit   None Visit Diagnoses     Well woman exam    -  Primary   Relevant Orders   IGP, Aptima HPV   Screening for cervical cancer       Relevant Orders   IGP, Aptima HPV   Screening for HPV (human papillomavirus)       Relevant Orders   IGP, Aptima HPV   Screen for colon cancer       Relevant Orders   Ambulatory referral to Gastroenterology   Bacterial vaginosis       Relevant Orders   POCT Wet Prep with KOH (Completed)      Meds ordered this encounter  Medications   metroNIDAZOLE (METROGEL) 0.75 % vaginal gel    Sig: Place 1 Applicatorful vaginally at bedtime for 5 days.    Dispense:  70 g    Refill:  0    Order Specific Question:   Supervising Provider    Answer:   [name] A [[id number]]   Referred for screening colonoscopy. Encouraged patient to get labs ordered on 04/04/21. Continue healthy lifestyle habits.  Return in about 1 year (around 06/07/2022) for physical.

## 2021-06-07 NOTE — Progress Notes (Signed)
   Subjective:    Patient ID: Meagan Bell, female    DOB: [birth year], 45 y.o.   MRN: [id number]  HPI  Well woman exam with pap The patient comes in today for a wellness visit.    A review of their health history was completed.  A review of medications was also completed.  Any needed refills; no  Eating habits: great  Falls/  MVA accidents in past few months: no  Regular exercise: walking  Specialist pt sees on regular basis: none  Preventative health issues were discussed.   Additional concerns: none  Review of Systems     Objective:   Physical Exam        Assessment & Plan:

## 2021-06-08 LAB — POCT WET PREP WITH KOH
KOH Prep POC: NEGATIVE
Trichomonas, UA: NEGATIVE
Yeast Wet Prep HPF POC: NEGATIVE
pH, Wet Prep: 6

## 2021-06-09 ENCOUNTER — Encounter: Payer: Self-pay | Admitting: Nurse Practitioner

## 2021-06-12 ENCOUNTER — Encounter: Payer: Self-pay | Admitting: Internal Medicine

## 2021-06-13 LAB — IGP, APTIMA HPV: HPV Aptima: NEGATIVE

## 2021-07-25 ENCOUNTER — Ambulatory Visit: Payer: Managed Care, Other (non HMO)

## 2022-01-27 ENCOUNTER — Other Ambulatory Visit: Payer: Self-pay | Admitting: Nurse Practitioner

## 2022-01-27 DIAGNOSIS — Z1231 Encounter for screening mammogram for malignant neoplasm of breast: Secondary | ICD-10-CM

## 2022-02-28 ENCOUNTER — Encounter (HOSPITAL_COMMUNITY): Payer: Self-pay

## 2022-02-28 ENCOUNTER — Ambulatory Visit (HOSPITAL_COMMUNITY)
Admission: EM | Admit: 2022-02-28 | Discharge: 2022-02-28 | Disposition: A | Discharge status: Home / Self Care | Payer: Managed Care, Other (non HMO) | Attending: Emergency Medicine | Admitting: Emergency Medicine

## 2022-02-28 DIAGNOSIS — J069 Acute upper respiratory infection, unspecified: Secondary | ICD-10-CM | POA: Diagnosis not present

## 2022-02-28 MED ORDER — BENZONATATE 100 MG PO CAPS: 100.0000 mg | ORAL_CAPSULE | Freq: Three times a day (TID) | ORAL | 0 refills | Status: DC

## 2022-02-28 MED ORDER — AMOXICILLIN-POT CLAVULANATE 875-125 MG PO TABS: 1.0000 | ORAL_TABLET | Freq: Two times a day (BID) | ORAL | 0 refills | Status: DC

## 2022-02-28 MED ORDER — PROMETHAZINE-DM 6.25-15 MG/5ML PO SYRP: 5.0000 mL | ORAL_SOLUTION | Freq: Four times a day (QID) | ORAL | 0 refills | Status: DC | PRN

## 2022-02-28 NOTE — ED Provider Notes (Signed)
[facility]    CSN: [id number] Arrival date & time: 02/28/22  1149      History   Chief Complaint Chief Complaint  Patient presents with   Sore Throat    HPI Meagan Bell is a 46 y.o. female.   Patient presents with chills, rhinorrhea, sore throat, and a productive cough that is now dry and generalized headaches for 7 days.  Overall feels fatigued and unwell with no improvement in symptoms.  Known sick contact.  Tolerating food and liquids.  Has attempted use of DayQuil, cough drops, lemon, hot tea which have been mildly helpful.  Denies shortness of breath, wheezing, fevers, ear pain, abdominal pain, nausea, vomiting or diarrhea.    Past Medical History:  Diagnosis Date   Infection    HSV    Patient Active Problem List   Diagnosis Date Noted   Vitamin D deficiency 01/24/2014   SVD (spontaneous vaginal delivery) 04/29/2012   Desires Sterilization 04/29/2012   Infection     Past Surgical History:  Procedure Laterality Date   NO PAST SURGERIES     TUBAL LIGATION  04/29/2012   Procedure: POST PARTUM TUBAL LIGATION;  Surgeon: Tereso NewcomerUgonna A Anyanwu, MD;  Location: [facility];  Service: Gynecology;  Laterality: Bilateral;    OB History     Gravida  3   Para  3   Term  3   Preterm      AB      Living  3      SAB      IAB      Ectopic      Multiple      Live Births  1            Home Medications    Prior to Admission medications   Medication Sig Start Date End Date Taking? Authorizing Provider  Vitamin D, Ergocalciferol, (DRISDOL) 1.25 MG (50000 UNIT) CAPS capsule Take 1 capsule (50,000 Units total) by mouth every 7 (seven) days. 05/21/20   Campbell RichesHoskins, Carolyn C, NP    Family History Family History  Problem Relation Age of Onset   Diabetes Father    COPD Maternal Grandmother    Hypertension Paternal Grandmother    Diabetes Paternal Grandmother    COPD Paternal Grandfather    Other Neg Hx     Social History Social History   Tobacco  Use   Smoking status: Never   Smokeless tobacco: Never  Vaping Use   Vaping Use: Never used  Substance Use Topics   Alcohol use: Yes    Comment: rare   Drug use: No     Allergies   Patient has no known allergies.   Review of Systems Review of Systems  Constitutional:  Positive for chills. Negative for activity change, appetite change, diaphoresis, fatigue, fever and unexpected weight change.  HENT:  Positive for rhinorrhea and sore throat. Negative for congestion, dental problem, drooling, ear discharge, ear pain, facial swelling, hearing loss, mouth sores, nosebleeds, postnasal drip, sinus pressure, sinus pain, sneezing, tinnitus, trouble swallowing and voice change.   Respiratory:  Positive for cough. Negative for apnea, choking, chest tightness, shortness of breath, wheezing and stridor.   Cardiovascular: Negative.   Gastrointestinal: Negative.   Musculoskeletal: Negative.   Skin: Negative.   Neurological:  Positive for headaches. Negative for dizziness, tremors, seizures, syncope, facial asymmetry, speech difficulty, weakness, light-headedness and numbness.    Physical Exam Triage Vital Signs ED Triage Vitals  Enc Vitals Group  BP 02/28/22 1313 117/78     Pulse Rate 02/28/22 1313 83     Resp 02/28/22 1313 19     Temp 02/28/22 1313 98.7 F (37.1 C)     Temp src --      SpO2 02/28/22 1313 97 %     Weight --      Height --      Head Circumference --      Peak Flow --      Pain Score 02/28/22 1312 0     Pain Loc --      Pain Edu? --      Excl. in GC? --    No data found.  Updated Vital Signs BP 117/78   Pulse 83   Temp 98.7 F (37.1 C)   Resp 19   LMP 01/31/2022   SpO2 97%   Visual Acuity Right Eye Distance:   Left Eye Distance:   Bilateral Distance:    Right Eye Near:   Left Eye Near:    Bilateral Near:     Physical Exam Constitutional:      Appearance: Normal appearance. She is well-developed.  HENT:     Head: Normocephalic.     Right Ear:  Tympanic membrane, ear canal and external ear normal.     Left Ear: Tympanic membrane, ear canal and external ear normal.     Nose: Congestion and rhinorrhea present.     Mouth/Throat:     Mouth: Mucous membranes are moist.     Pharynx: Oropharynx is clear. No posterior oropharyngeal erythema.     Tonsils: No tonsillar exudate. 0 on the right. 0 on the left.  Eyes:     Extraocular Movements: Extraocular movements intact.  Cardiovascular:     Rate and Rhythm: Normal rate and regular rhythm.     Pulses: Normal pulses.     Heart sounds: Normal heart sounds.  Pulmonary:     Effort: Pulmonary effort is normal.     Breath sounds: Normal breath sounds.  Musculoskeletal:     Cervical back: Normal range of motion and neck supple.  Skin:    General: Skin is warm and dry.  Neurological:     General: No focal deficit present.     Mental Status: She is alert and oriented to person, place, and time.  Psychiatric:        Mood and Affect: Mood normal.        Behavior: Behavior normal.     UC Treatments / Results  Labs (all labs ordered are listed, but only abnormal results are displayed) Labs Reviewed - No data to display  EKG   Radiology No results found.  Procedures Procedures (including critical care time)  Medications Ordered in UC Medications - No data to display  Initial Impression / Assessment and Plan / UC Course  I have reviewed the triage vital signs and the nursing notes.  Pertinent labs & imaging results that were available during my care of the patient were reviewed by me and considered in my medical decision making (see chart for details).  Viral URI with cough  Vital signs are stable with O2 saturation 97%, lungs are clear to auscultation, low suspicion for pneumonia, pneumothorax or bronchitis, discussed with patient and will defer imaging today, Tessalon and promethazine DM prescribed for outpatient use as worrisome symptom, Augmentin 7-day course placed at  pharmacy for day 10 of illness if no improvement seen, may continue use of over-the-counter medication in addition to supportive  treatments, may follow-up with urgent care or primary doctor as needed for persisting or worsening symptoms Final Clinical Impressions(s) / UC Diagnoses   Final diagnoses:  None   Discharge Instructions   None    ED Prescriptions   None    PDMP not reviewed this encounter.   Valinda Hoar, NP 02/28/22 1334

## 2022-02-28 NOTE — ED Triage Notes (Signed)
Pt presents with complaints of sore throat, cough, drainage, and headache.

## 2022-02-28 NOTE — Discharge Instructions (Signed)
Your symptoms today are most likely being caused by a virus and should steadily improve in time it can take up to 7 to 10 days before you truly start to see a turnaround however things will get better  May use Tessalon pill every 8 hours as needed to help calm coughing  You may use cough syrup every 6 hours as needed for additional comfort, be mindful this medication may make you drowsy, if this occurs you may attempt half a dose or use at bedtime  If you do not see any improvement in your symptoms by Mar 04, 2022 which is day 10 of your illness you may begin use of an antibiotic to cover for any bacteria in the upper airways or sinuses, take Augmentin twice daily for 7 days  Any of the following below in addition to help with your symptoms    You can take Tylenol and/or Ibuprofen as needed for fever reduction and pain relief.   For cough: honey 1/2 to 1 teaspoon (you can dilute the honey in water or another fluid).  You can also use guaifenesin  for cough. You can use a humidifier for chest congestion and cough.  If you don't have a humidifier, you can sit in the bathroom with the hot shower running.      For sore throat: try warm salt water gargles, cepacol lozenges, throat spray, warm tea or water with lemon/honey, popsicles or ice, or OTC cold relief medicine for throat discomfort.   For congestion: take a daily anti-histamine like Zyrtec, Claritin, and a oral decongestant, such as pseudoephedrine.  You can also use Flonase 1-2 sprays in each nostril daily.   It is important to stay hydrated: drink plenty of fluids (water, gatorade/powerade/pedialyte, juices, or teas) to keep your throat moisturized and help further relieve irritation/discomfort.

## 2022-04-18 ENCOUNTER — Ambulatory Visit
Admission: RE | Admit: 2022-04-18 | Discharge: 2022-04-18 | Disposition: A | Discharge status: Home / Self Care | Payer: Managed Care, Other (non HMO) | Source: Ambulatory Visit | Attending: Nurse Practitioner | Admitting: Nurse Practitioner

## 2022-04-18 DIAGNOSIS — Z1231 Encounter for screening mammogram for malignant neoplasm of breast: Secondary | ICD-10-CM

## 2022-04-22 ENCOUNTER — Other Ambulatory Visit: Payer: Self-pay | Admitting: Nurse Practitioner

## 2022-04-22 DIAGNOSIS — R928 Other abnormal and inconclusive findings on diagnostic imaging of breast: Secondary | ICD-10-CM

## 2022-04-24 IMAGING — MG MM DIGITAL SCREENING BILAT W/ TOMO AND CAD
8 series · 8 of 24 positions shown · non-contrast
Comparison: Previous exam(s).

CLINICAL DATA: Screening.

EXAM:
DIGITAL SCREENING BILATERAL MAMMOGRAM WITH TOMOSYNTHESIS AND CAD
TECHNIQUE: Bilateral screening digital craniocaudal and mediolateral oblique
mammograms were obtained. Bilateral screening digital breast
tomosynthesis was performed. The images were evaluated with
computer-aided detection.

[R CC synth-2D]
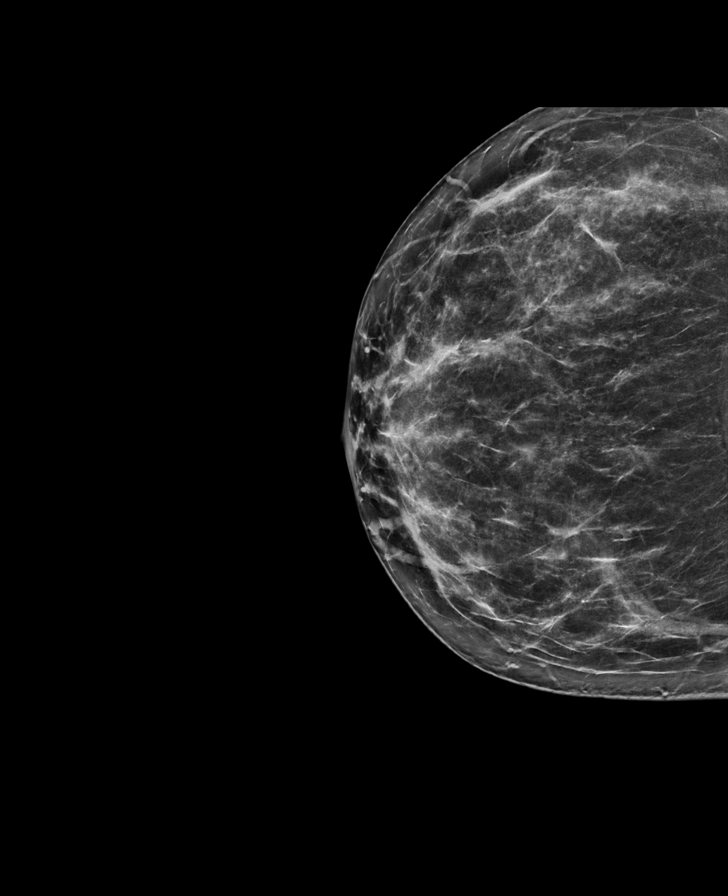

[L MLO synth-2D]
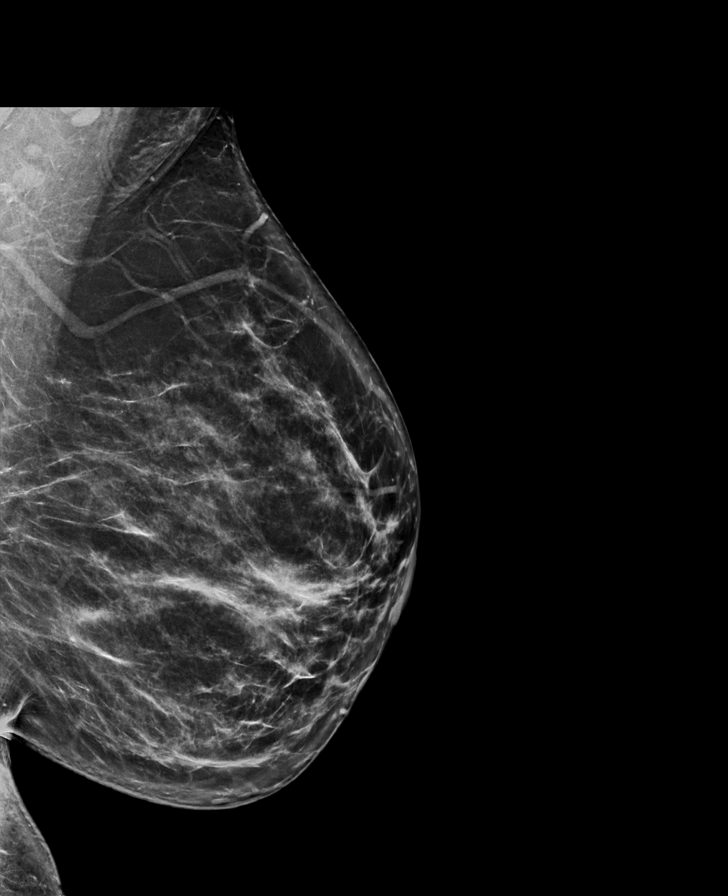

[L CC synth-2D]
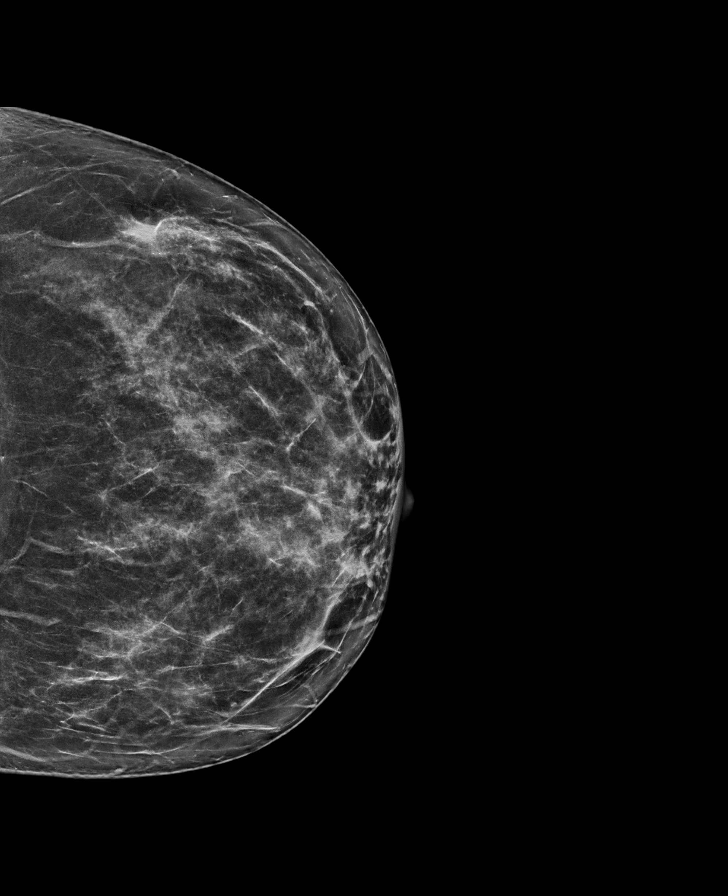

[R MLO synth-2D]
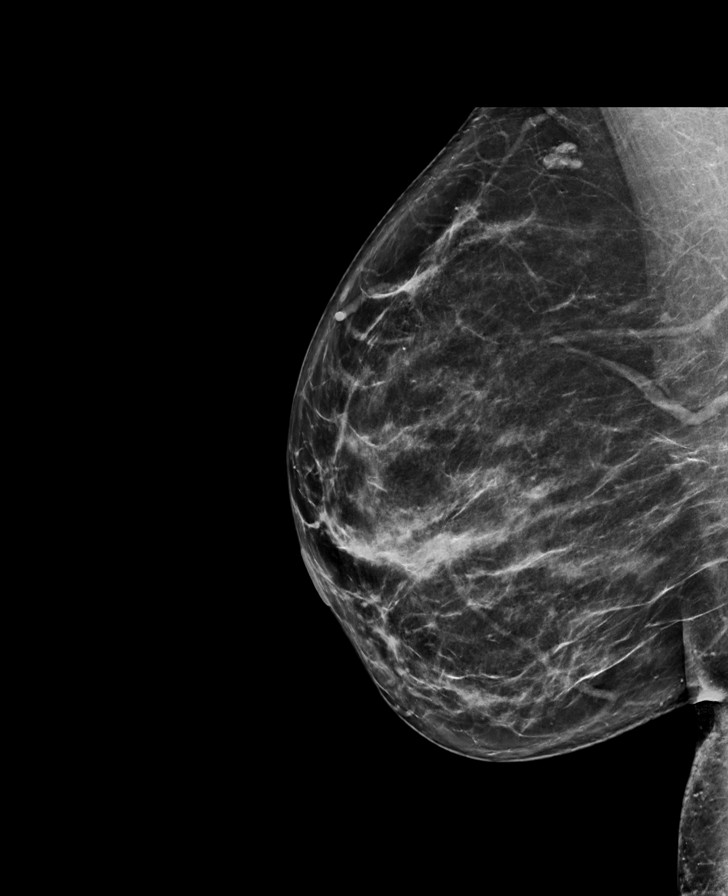

[L CC tomo · tomo slice 38/75.0]
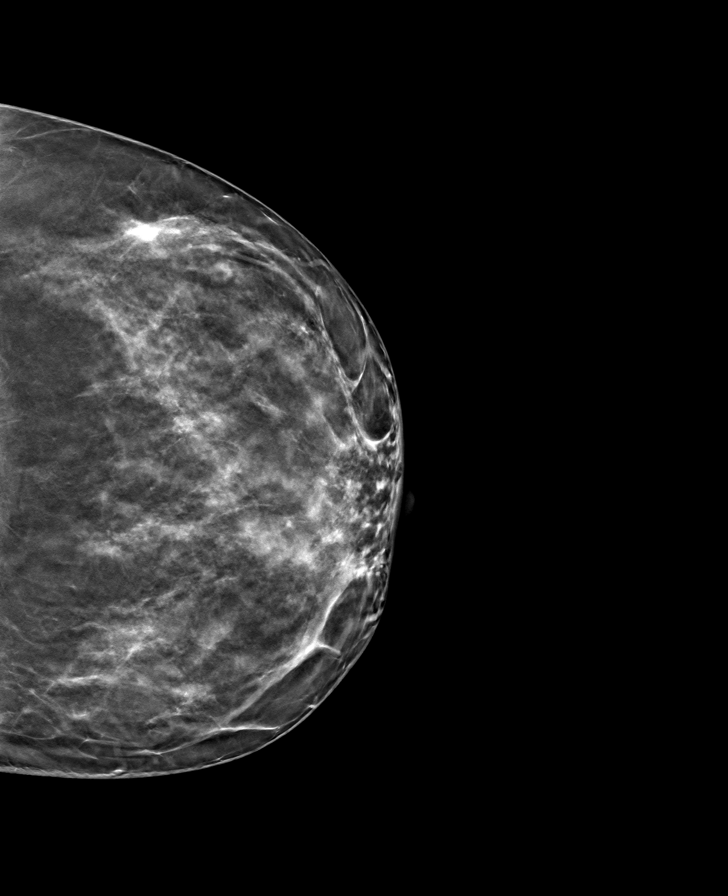

[L MLO tomo · tomo slice 42/83.0]
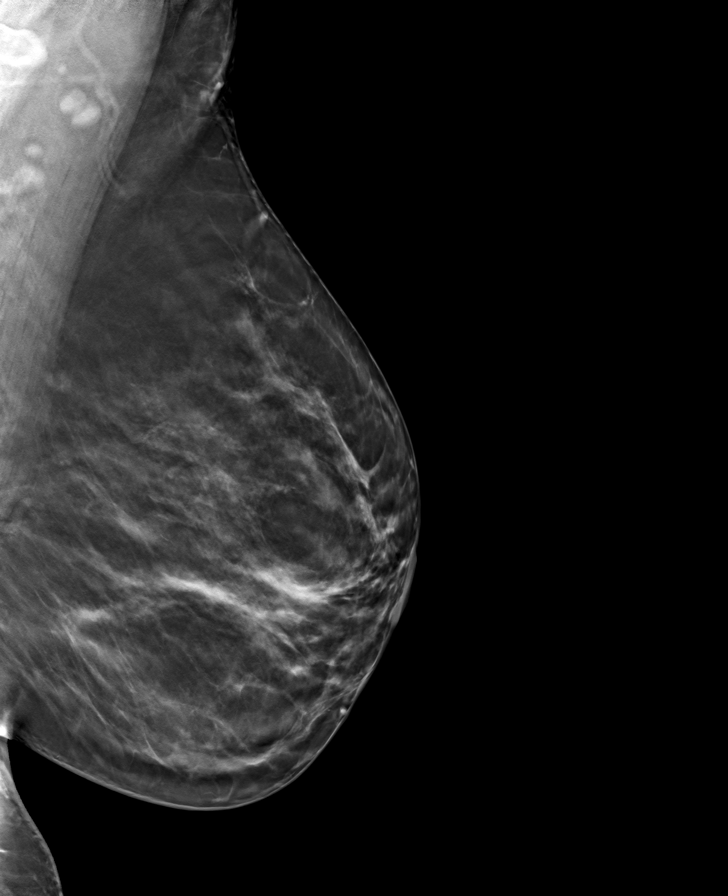

[R MLO tomo · tomo slice 41/80.0]
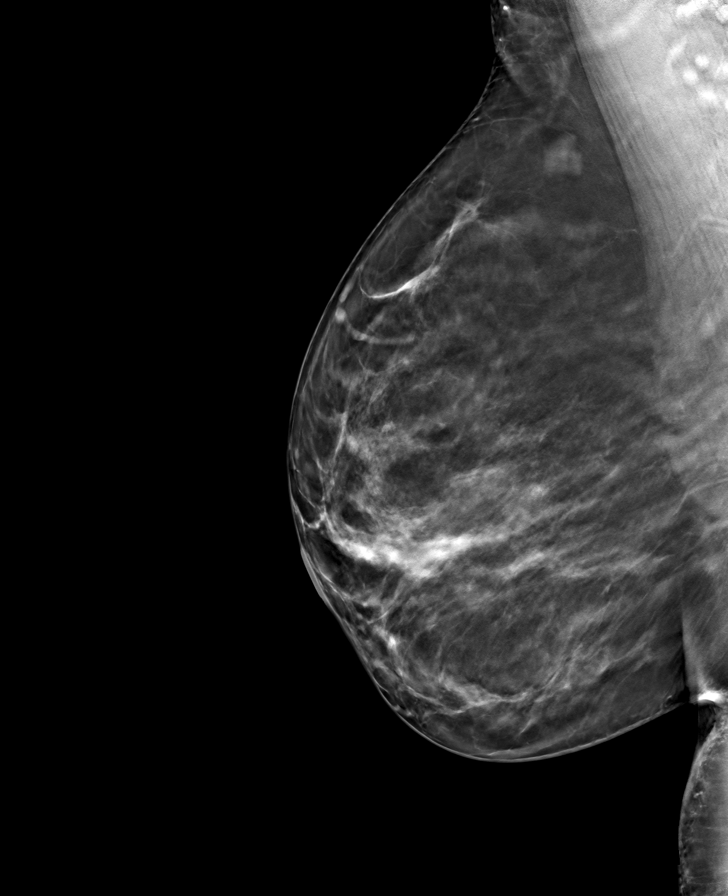

[R CC tomo · tomo slice 39/77.0]
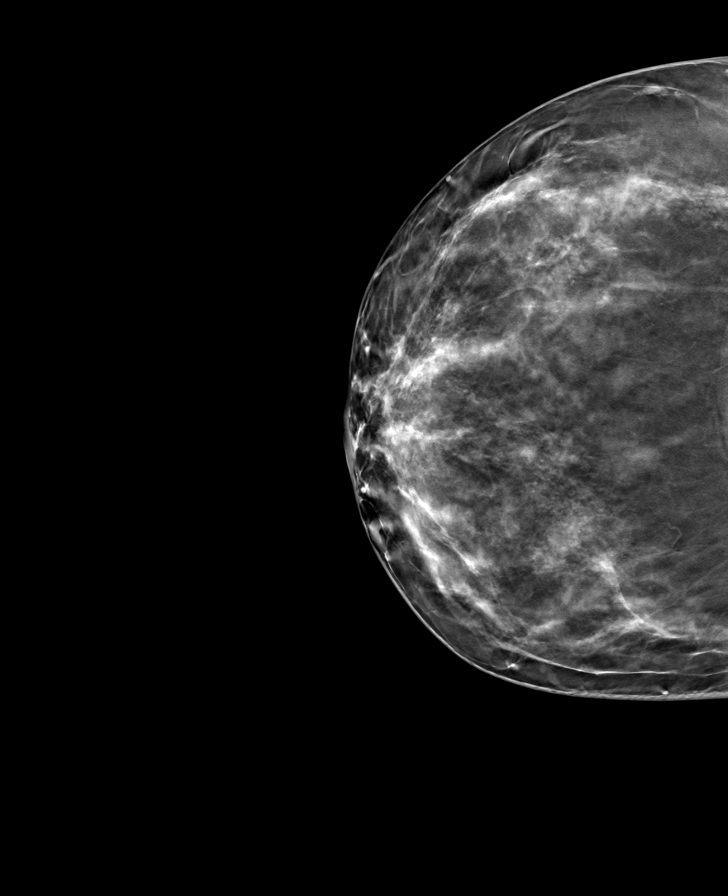

[8 of 24 positions shown; findings below may reference images not displayed]

ACR Breast Density Category b: There are scattered areas of
fibroglandular density.
FINDINGS: There are no findings suspicious for malignancy.
IMPRESSION: No mammographic evidence of malignancy. A result letter of this
screening mammogram will be mailed directly to the patient.

RECOMMENDATION:
Screening mammogram in one year. (Code:51-O-LD2)

BI-RADS CATEGORY  1: Negative.

## 2022-04-29 ENCOUNTER — Ambulatory Visit
Admission: RE | Admit: 2022-04-29 | Discharge: 2022-04-29 | Disposition: A | Discharge status: Home / Self Care | Payer: Managed Care, Other (non HMO) | Source: Ambulatory Visit | Attending: Nurse Practitioner | Admitting: Nurse Practitioner

## 2022-04-29 DIAGNOSIS — R928 Other abnormal and inconclusive findings on diagnostic imaging of breast: Secondary | ICD-10-CM

## 2022-05-13 ENCOUNTER — Ambulatory Visit (INDEPENDENT_AMBULATORY_CARE_PROVIDER_SITE_OTHER): Payer: Managed Care, Other (non HMO) | Admitting: Family Medicine

## 2022-05-13 ENCOUNTER — Encounter: Payer: Self-pay | Admitting: Family Medicine

## 2022-05-13 VITALS — BP 136/89 | HR 79 | Temp 98.0°F | Ht 68.5 in | Wt 191.4 lb

## 2022-05-13 DIAGNOSIS — Z13220 Encounter for screening for lipoid disorders: Secondary | ICD-10-CM | POA: Diagnosis not present

## 2022-05-13 DIAGNOSIS — R739 Hyperglycemia, unspecified: Secondary | ICD-10-CM

## 2022-05-13 DIAGNOSIS — E559 Vitamin D deficiency, unspecified: Secondary | ICD-10-CM

## 2022-05-13 DIAGNOSIS — R030 Elevated blood-pressure reading, without diagnosis of hypertension: Secondary | ICD-10-CM

## 2022-05-13 DIAGNOSIS — Z130 Encounter for screening for diseases of the blood and blood-forming organs and certain disorders involving the immune mechanism: Secondary | ICD-10-CM

## 2022-05-13 DIAGNOSIS — Z1211 Encounter for screening for malignant neoplasm of colon: Secondary | ICD-10-CM

## 2022-05-13 DIAGNOSIS — Z0000 Encounter for general adult medical examination without abnormal findings: Secondary | ICD-10-CM

## 2022-05-13 NOTE — Patient Instructions (Signed)
Labs when you like.  Cologuard ordered.  Follow up annually.  Take care  Dr. Adriana Simas

## 2022-05-14 DIAGNOSIS — R030 Elevated blood-pressure reading, without diagnosis of hypertension: Secondary | ICD-10-CM | POA: Insufficient documentation

## 2022-05-14 DIAGNOSIS — Z0000 Encounter for general adult medical examination without abnormal findings: Secondary | ICD-10-CM | POA: Insufficient documentation

## 2022-05-14 LAB — CMP14+EGFR
ALT: 18 IU/L (ref 0–32)
AST: 19 IU/L (ref 0–40)
Albumin/Globulin Ratio: 2 (ref 1.2–2.2)
Albumin: 4.3 g/dL (ref 3.9–4.9)
Alkaline Phosphatase: 56 IU/L (ref 44–121)
BUN/Creatinine Ratio: 13 (ref 9–23)
BUN: 12 mg/dL (ref 6–24)
Bilirubin Total: 1 mg/dL (ref 0.0–1.2)
CO2: 19 mmol/L — ABNORMAL LOW (ref 20–29)
Calcium: 9 mg/dL (ref 8.7–10.2)
Chloride: 106 mmol/L (ref 96–106)
Creatinine, Ser: 0.9 mg/dL (ref 0.57–1.00)
Globulin, Total: 2.1 g/dL (ref 1.5–4.5)
Glucose: 75 mg/dL (ref 70–99)
Potassium: 4.2 mmol/L (ref 3.5–5.2)
Sodium: 141 mmol/L (ref 134–144)
Total Protein: 6.4 g/dL (ref 6.0–8.5)
eGFR: 80 mL/min/{1.73_m2} (ref >59)

## 2022-05-14 LAB — CBC
Hematocrit: 42.8 % (ref 34.0–46.6)
Hemoglobin: 14.7 g/dL (ref 11.1–15.9)
MCH: 30.9 pg (ref 26.6–33.0)
MCHC: 34.3 g/dL (ref 31.5–35.7)
MCV: 90 fL (ref 79–97)
Platelets: 222 10*3/uL (ref 150–450)
RBC: 4.76 x10E6/uL (ref 3.77–5.28)
RDW: 13 % (ref 11.7–15.4)
WBC: 8.3 10*3/uL (ref 3.4–10.8)

## 2022-05-14 LAB — LIPID PANEL
Chol/HDL Ratio: 2.4 ratio (ref 0.0–4.4)
Cholesterol, Total: 111 mg/dL (ref 100–199)
HDL: 46 mg/dL (ref >39)
LDL Chol Calc (NIH): 47 mg/dL (ref 0–99)
Triglycerides: 91 mg/dL (ref 0–149)
VLDL Cholesterol Cal: 18 mg/dL (ref 5–40)

## 2022-05-14 LAB — HEMOGLOBIN A1C
Est. average glucose Bld gHb Est-mCnc: 111 mg/dL
Hgb A1c MFr Bld: 5.5 % (ref 4.8–5.6)

## 2022-05-14 LAB — VITAMIN D 25 HYDROXY (VIT D DEFICIENCY, FRACTURES): Vit D, 25-Hydroxy: 13.2 ng/mL — ABNORMAL LOW (ref 30.0–100.0)

## 2022-05-14 MED ORDER — VITAMIN D (ERGOCALCIFEROL) 1.25 MG (50000 UNIT) PO CAPS: 50000.0000 [IU] | ORAL_CAPSULE | ORAL | 0 refills | Status: DC

## 2022-05-14 NOTE — Progress Notes (Signed)
Subjective:  Patient ID: Meagan Bell, female    DOB: 30-Jul-1976  Age: 46 y.o. MRN: [id number]  CC: Chief Complaint  Patient presents with   Establish Care    Pt here to establish care with Dr.Malaika Arnall. has seen Hoyle Sauer previously in September 2022    HPI:  46 year old female presents to establish care with me.  Patient states that she is doing well.  She states that she uses Azo cranberry as needed to help prevent UTIs.  She has no complaints today.  Patient has had a recent mammogram.  Pap smear up-to-date.  Tetanus up-to-date.  Has had HIV and hepatitis C screening.  She is in need of colonoscopy.  Patient would like to proceed with Cologuard and lieu of colonoscopy at this time.  Patient Active Problem List   Diagnosis Date Noted   Elevated BP without diagnosis of hypertension 05/14/2022   Annual physical exam 05/14/2022   Vitamin D deficiency 01/24/2014    Social Hx   Social History   Socioeconomic History   Marital status: Single    Spouse name: Not on file   Number of children: Not on file   Years of education: Not on file   Highest education level: Not on file  Occupational History   Not on file  Tobacco Use   Smoking status: Never   Smokeless tobacco: Never  Vaping Use   Vaping Use: Never used  Substance and Sexual Activity   Alcohol use: Yes    Comment: rare   Drug use: No   Sexual activity: Yes    Birth control/protection: Surgical, Condom  Other Topics Concern   Not on file  Social History Narrative   Not on file   Social Determinants of Health   Financial Resource Strain: Not on file  Food Insecurity: Not on file  Transportation Needs: Not on file  Physical Activity: Not on file  Stress: Not on file  Social Connections: Not on file    Review of Systems  Constitutional: Negative.   Respiratory: Negative.    Cardiovascular: Negative.    Objective:  BP 136/89   Pulse 79   Temp 98 F (36.7 C)   Ht 5' 8.5" (1.74 m)   Wt 191 lb 6.4 oz  (86.8 kg)   SpO2 100%   BMI 28.68 kg/m      05/13/2022    3:07 PM 02/28/2022    1:13 PM 06/07/2021    2:39 PM  BP/Weight  Systolic BP 601 093 235  Diastolic BP 89 78 90  Wt. (Lbs) 191.4  184  BMI 28.68 kg/m2  27.57 kg/m2    Physical Exam Vitals and nursing note reviewed.  Constitutional:      General: She is not in acute distress.    Appearance: Normal appearance.  HENT:     Head: Normocephalic and atraumatic.     Mouth/Throat:     Pharynx: Oropharynx is clear.  Eyes:     General:        Right eye: No discharge.        Left eye: No discharge.     Conjunctiva/sclera: Conjunctivae normal.  Cardiovascular:     Rate and Rhythm: Normal rate and regular rhythm.  Pulmonary:     Effort: Pulmonary effort is normal.     Breath sounds: Normal breath sounds. No wheezing, rhonchi or rales.  Abdominal:     General: There is no distension.     Palpations: Abdomen is soft.  Tenderness: There is no abdominal tenderness.  Neurological:     General: No focal deficit present.     Mental Status: She is alert.  Psychiatric:        Mood and Affect: Mood normal.        Behavior: Behavior normal.     Lab Results  Component Value Date   WBC 8.3 05/13/2022   HGB 14.7 05/13/2022   HCT 42.8 05/13/2022   PLT 222 05/13/2022   GLUCOSE 75 05/13/2022   CHOL 111 05/13/2022   TRIG 91 05/13/2022   HDL 46 05/13/2022   LDLCALC 47 05/13/2022   ALT 18 05/13/2022   AST 19 05/13/2022   NA 141 05/13/2022   K 4.2 05/13/2022   CL 106 05/13/2022   CREATININE 0.90 05/13/2022   BUN 12 05/13/2022   CO2 19 (L) 05/13/2022   TSH 1.451 01/20/2014   HGBA1C 5.5 05/13/2022     Assessment & Plan:   Problem List Items Addressed This Visit       Other   Annual physical exam - Primary    Cologuard ordered.  Remainder remainder of healthcare maintenance/preventative health care is up-to-date. Labs today.      Elevated BP without diagnosis of hypertension   Vitamin D deficiency   Relevant Orders    Vitamin D, 25-hydroxy (Completed)   Other Visit Diagnoses     Screening, lipid       Relevant Orders   Lipid panel (Completed)   Blood glucose elevated       Relevant Orders   CMP14+EGFR (Completed)   Hemoglobin A1c (Completed)   Screening for deficiency anemia       Relevant Orders   CBC (Completed)   Colon cancer screening       Relevant Orders   Cologuard       Follow-up:  Annually  Thersa Salt DO [place]

## 2022-05-14 NOTE — Addendum Note (Signed)
Addended by: Margaretha Sheffield on: 05/14/2022 02:52 PM   Modules accepted: Orders

## 2022-05-14 NOTE — Assessment & Plan Note (Signed)
Cologuard ordered.  Remainder remainder of healthcare maintenance/preventative health care is up-to-date. Labs today.

## 2022-06-03 LAB — COLOGUARD: COLOGUARD: NEGATIVE

## 2023-06-19 ENCOUNTER — Ambulatory Visit (INDEPENDENT_AMBULATORY_CARE_PROVIDER_SITE_OTHER): Payer: Managed Care, Other (non HMO) | Admitting: Nurse Practitioner

## 2023-06-19 ENCOUNTER — Encounter: Payer: Self-pay | Admitting: Nurse Practitioner

## 2023-06-19 VITALS — BP 122/76 | HR 88 | Temp 97.5°F | Ht 68.5 in | Wt 196.0 lb

## 2023-06-19 DIAGNOSIS — Z13228 Encounter for screening for other metabolic disorders: Secondary | ICD-10-CM

## 2023-06-19 DIAGNOSIS — Z1329 Encounter for screening for other suspected endocrine disorder: Secondary | ICD-10-CM

## 2023-06-19 DIAGNOSIS — E559 Vitamin D deficiency, unspecified: Secondary | ICD-10-CM

## 2023-06-19 DIAGNOSIS — Z01411 Encounter for gynecological examination (general) (routine) with abnormal findings: Secondary | ICD-10-CM | POA: Diagnosis not present

## 2023-06-19 DIAGNOSIS — Z130 Encounter for screening for diseases of the blood and blood-forming organs and certain disorders involving the immune mechanism: Secondary | ICD-10-CM

## 2023-06-19 DIAGNOSIS — Z13220 Encounter for screening for lipoid disorders: Secondary | ICD-10-CM

## 2023-06-19 DIAGNOSIS — Z01419 Encounter for gynecological examination (general) (routine) without abnormal findings: Secondary | ICD-10-CM

## 2023-06-19 NOTE — Patient Instructions (Signed)
[facility] vitamin D 10,000 units take 2 pills once a week

## 2023-06-19 NOTE — Progress Notes (Unsigned)
Subjective:    Patient ID: Meagan Bell, female    DOB: November 12, 1975, 47 y.o.   MRN: [id number]  HPI The patient comes in today for a wellness visit.    A review of their health history was completed.  A review of medications was also completed.  Any needed refills; none  Eating habits: excellent  Falls/  MVA accidents in past few months: none  Regular exercise: no  Specialist pt sees on regular basis: none  Preventative health issues were discussed.   Additional concerns: internmittent low abd pain    Review of Systems     Objective:   Physical Exam        Assessment & Plan:

## 2023-06-21 ENCOUNTER — Encounter: Payer: Self-pay | Admitting: Nurse Practitioner

## 2023-06-30 ENCOUNTER — Telehealth: Payer: Self-pay

## 2023-06-30 ENCOUNTER — Other Ambulatory Visit: Payer: Self-pay

## 2023-06-30 DIAGNOSIS — Z1231 Encounter for screening mammogram for malignant neoplasm of breast: Secondary | ICD-10-CM

## 2023-06-30 NOTE — Telephone Encounter (Signed)
Left a message for the patient to inform her of the mammogram order and date

## 2023-07-06 ENCOUNTER — Encounter (HOSPITAL_COMMUNITY): Payer: Self-pay

## 2023-07-06 ENCOUNTER — Ambulatory Visit (HOSPITAL_COMMUNITY)
Admission: RE | Admit: 2023-07-06 | Discharge: 2023-07-06 | Disposition: A | Discharge status: Home / Self Care | Payer: Managed Care, Other (non HMO) | Source: Ambulatory Visit | Attending: Family Medicine | Admitting: Family Medicine

## 2023-07-06 DIAGNOSIS — Z1231 Encounter for screening mammogram for malignant neoplasm of breast: Secondary | ICD-10-CM | POA: Insufficient documentation

## 2023-12-31 ENCOUNTER — Ambulatory Visit: Admitting: Family Medicine

## 2023-12-31 VITALS — BP 112/68 | HR 94 | Temp 97.0°F | Ht 68.5 in | Wt 184.0 lb

## 2023-12-31 DIAGNOSIS — B349 Viral infection, unspecified: Secondary | ICD-10-CM | POA: Diagnosis not present

## 2023-12-31 MED ORDER — ONDANSETRON 4 MG PO TBDP: 4.0000 mg | ORAL_TABLET | Freq: Three times a day (TID) | ORAL | 0 refills | Status: DC | PRN

## 2023-12-31 MED ORDER — DIPHENOXYLATE-ATROPINE 2.5-0.025 MG PO TABS: 1.0000 | ORAL_TABLET | Freq: Four times a day (QID) | ORAL | 0 refills | Status: DC | PRN

## 2023-12-31 NOTE — Patient Instructions (Signed)
Rest. Fluids.  Medications as directed.  Take care  Dr. Virgia Kelner 

## 2023-12-31 NOTE — Assessment & Plan Note (Signed)
 Overall well-appearing.  Benign exam.  Lomotil and Zofran as needed.  Supportive care.  Work note given.

## 2023-12-31 NOTE — Progress Notes (Signed)
 Subjective:  Patient ID: Meagan Bell, female    DOB: [birth year]  Age: 48 y.o. MRN: [id number]  CC:   Chief Complaint  Patient presents with   Diarrhea    On and off all day and night since sunday    HPI:  48 year old female presents for evaluation of the above  Patient reports that she has been sick since Sunday.  Reports headache, nausea, fatigue, runny nose, diarrhea, body aches.  No fever.  Overall her symptoms have improved except for the diarrhea.  She states that she has clear rhinorrhea.  Still having decreased appetite as well.  No other associated symptoms.  No other complaints.  Patient Active Problem List   Diagnosis Date Noted   Viral illness 12/31/2023   Annual physical exam 05/14/2022   Vitamin D deficiency 01/24/2014    Social Hx   Social History   Socioeconomic History   Marital status: Single    Spouse name: Not on file   Number of children: Not on file   Years of education: Not on file   Highest education level: Not on file  Occupational History   Not on file  Tobacco Use   Smoking status: Never   Smokeless tobacco: Never  Vaping Use   Vaping status: Never Used  Substance and Sexual Activity   Alcohol use: Yes    Comment: rare   Drug use: No   Sexual activity: Yes    Birth control/protection: Surgical, Condom  Other Topics Concern   Not on file  Social History Narrative   Not on file   Social Drivers of Health   Financial Resource Strain: Not on file  Food Insecurity: Not on file  Transportation Needs: Not on file  Physical Activity: Not on file  Stress: Not on file  Social Connections: Unknown (02/18/2022)   Received from [facility]   Social Network    Social Network: Not on file    Review of Systems Per HPI  Objective:  BP 112/68   Pulse 94   Temp (!) 97 F (36.1 C)   Ht 5' 8.5" (1.74 m)   Wt 184 lb (83.5 kg)   SpO2 99%   BMI 27.57 kg/m      12/31/2023   10:32 AM 06/19/2023    1:07 PM 05/13/2022     3:07 PM  BP/Weight  Systolic BP 112 122 136  Diastolic BP 68 76 89  Wt. (Lbs) 184 196 191.4  BMI 27.57 kg/m2 29.37 kg/m2 28.68 kg/m2    Physical Exam Vitals and nursing note reviewed.  Constitutional:      General: She is not in acute distress.    Appearance: Normal appearance.  HENT:     Head: Normocephalic and atraumatic.  Eyes:     General:        Right eye: No discharge.        Left eye: No discharge.     Conjunctiva/sclera: Conjunctivae normal.  Cardiovascular:     Rate and Rhythm: Normal rate and regular rhythm.  Pulmonary:     Effort: Pulmonary effort is normal.     Breath sounds: Normal breath sounds. No wheezing, rhonchi or rales.  Neurological:     Mental Status: She is alert.  Psychiatric:        Mood and Affect: Mood normal.        Behavior: Behavior normal.     Lab Results  Component Value Date   WBC 8.3 05/13/2022  HGB 14.7 05/13/2022   HCT 42.8 05/13/2022   PLT 222 05/13/2022   GLUCOSE 75 05/13/2022   CHOL 111 05/13/2022   TRIG 91 05/13/2022   HDL 46 05/13/2022   LDLCALC 47 05/13/2022   ALT 18 05/13/2022   AST 19 05/13/2022   NA 141 05/13/2022   K 4.2 05/13/2022   CL 106 05/13/2022   CREATININE 0.90 05/13/2022   BUN 12 05/13/2022   CO2 19 (L) 05/13/2022   TSH 1.451 01/20/2014   HGBA1C 5.5 05/13/2022     Assessment & Plan:  Viral illness Assessment & Plan: Overall well-appearing.  Benign exam.  Lomotil and Zofran as needed.  Supportive care.  Work note given.   Other orders -     Diphenoxylate-Atropine; Take 1 tablet by mouth 4 (four) times daily as needed for diarrhea or loose stools.  Dispense: 30 tablet; Refill: 0 -     Ondansetron; Take 1 tablet (4 mg total) by mouth every 8 (eight) hours as needed for nausea or vomiting.  Dispense: 20 tablet; Refill: 0    Follow-up:  Return if symptoms worsen or fail to improve.  Everlene Other DO [facility]

## 2024-06-03 ENCOUNTER — Other Ambulatory Visit (HOSPITAL_COMMUNITY): Payer: Self-pay | Admitting: Nurse Practitioner

## 2024-06-03 DIAGNOSIS — Z1231 Encounter for screening mammogram for malignant neoplasm of breast: Secondary | ICD-10-CM

## 2024-06-20 ENCOUNTER — Ambulatory Visit: Admitting: Nurse Practitioner

## 2024-06-20 ENCOUNTER — Encounter: Payer: Self-pay | Admitting: Nurse Practitioner

## 2024-06-20 VITALS — BP 130/78 | HR 79 | Ht 68.5 in | Wt 197.0 lb

## 2024-06-20 DIAGNOSIS — R5383 Other fatigue: Secondary | ICD-10-CM | POA: Diagnosis not present

## 2024-06-20 DIAGNOSIS — E559 Vitamin D deficiency, unspecified: Secondary | ICD-10-CM

## 2024-06-20 DIAGNOSIS — Z01411 Encounter for gynecological examination (general) (routine) with abnormal findings: Secondary | ICD-10-CM

## 2024-06-20 DIAGNOSIS — Z0184 Encounter for antibody response examination: Secondary | ICD-10-CM

## 2024-06-20 DIAGNOSIS — Z1151 Encounter for screening for human papillomavirus (HPV): Secondary | ICD-10-CM

## 2024-06-20 DIAGNOSIS — Z113 Encounter for screening for infections with a predominantly sexual mode of transmission: Secondary | ICD-10-CM

## 2024-06-20 DIAGNOSIS — N813 Complete uterovaginal prolapse: Secondary | ICD-10-CM

## 2024-06-20 DIAGNOSIS — Z01419 Encounter for gynecological examination (general) (routine) without abnormal findings: Secondary | ICD-10-CM

## 2024-06-20 DIAGNOSIS — Z13220 Encounter for screening for lipoid disorders: Secondary | ICD-10-CM

## 2024-06-20 DIAGNOSIS — Z124 Encounter for screening for malignant neoplasm of cervix: Secondary | ICD-10-CM

## 2024-06-20 NOTE — Progress Notes (Signed)
 Subjective:    Patient ID: Meagan Bell, female    DOB: [birth year], 48 y.o.   MRN: [id number]  HPI CC: Patient comes in for a well woman exam.   Patient arrived with complaints of vaginal itching, discharge and odor that is outside of her normal for the past 2/3 weeks. She has not tried any medications for relief. She is not having any abdominal or back pain. She is not experiencing any N/V or dysuria. In addition, she is open to STI testing today.   Diet: well rounded with plenty of fluids Activity: walks daily Sleep: 8 hours of sleep nightly Eye exam: completed on 12/2023 Dental exam: completed on 06/2024 Mammogram: Appointment on 07/2024\ Colonoscopy: colorguard completed and next screening is dated for 2026 Sexual History: current female partner; no other new partners Cycle: Regular menstrual cycles with no complaints   Review of Systems  Constitutional:  Positive for fatigue. Negative for appetite change and fever.  HENT:  Negative for sore throat and trouble swallowing.   Respiratory:  Negative for cough, chest tightness, shortness of breath and wheezing.   Cardiovascular:  Negative for chest pain.  Gastrointestinal:  Negative for abdominal distention, abdominal pain, constipation, diarrhea, nausea and vomiting.  Genitourinary:  Positive for vaginal discharge. Negative for difficulty urinating, dysuria, enuresis, frequency, genital sores, menstrual problem, pelvic pain and urgency.        Objective:   Physical Exam Vitals and nursing note reviewed. Exam conducted with a chaperone present.  Constitutional:      General: She is not in acute distress.    Appearance: She is well-developed.  Neck:     Thyroid: No thyromegaly.     Trachea: No tracheal deviation.     Comments: Thyroid non tender to palpation. No mass or goiter noted.  Cardiovascular:     Rate and Rhythm: Normal rate and regular rhythm.     Heart sounds: Normal heart sounds. No murmur heard. Pulmonary:      Effort: Pulmonary effort is normal.     Breath sounds: Normal breath sounds.  Chest:  Breasts:    Right: No swelling, inverted nipple, mass, skin change or tenderness.     Left: No swelling, inverted nipple, mass, skin change or tenderness.  Abdominal:     General: There is no distension.     Palpations: Abdomen is soft.     Tenderness: There is no abdominal tenderness.  Genitourinary:    General: Normal vulva.     Exam position: Lithotomy position.     Labia:        Right: No rash, tenderness or lesion.        Left: No rash, tenderness or lesion.      Vagina: Prolapsed vaginal walls present. No vaginal discharge, erythema, tenderness or bleeding.     Cervix: No cervical motion tenderness, friability, lesion or erythema.     Uterus: With uterine prolapse.      Adnexa:        Right: No mass, tenderness or fullness.         Left: No mass, tenderness or fullness.       Comments: Third degree uterine prolapse noted. Cervix/vagina was noted just outside of introitus.  Musculoskeletal:     Cervical back: Normal range of motion and neck supple.  Lymphadenopathy:     Cervical: No cervical adenopathy.     Upper Body:     Right upper body: No supraclavicular, axillary or pectoral adenopathy.  Left upper body: No supraclavicular, axillary or pectoral adenopathy.  Skin:    General: Skin is warm and dry.     Findings: No rash.  Neurological:     Mental Status: She is alert and oriented to person, place, and time.  Psychiatric:        Mood and Affect: Mood normal.        Behavior: Behavior normal.        Thought Content: Thought content normal.        Judgment: Judgment normal.    Vitals:   06/20/24 1430  BP: 130/78  Pulse: 79  Height: 5' 8.5 (1.74 m)  Weight: 89.4 kg  SpO2: 98%  BMI (Calculated): 29.51           Assessment & Plan:  1. Well woman exam (Primary) - CBC with Differential/Platelet - Comprehensive metabolic panel with GFR - Lipid panel  2. Screening for  cervical cancer - IGP,CtNgTv,Apt HPV  3. Screening for HPV (human papillomavirus) - IGP,CtNgTv,Apt HPV  4. Screen for STD (sexually transmitted disease) - IGP,CtNgTv,Apt HPV  5. Third degree uterine prolapse Patient educated to contact the office if she starts to experience any vaginal discomfort or pain or urinary symptoms. Defers intervention at this time.   6. Fatigue, unspecified type - CBC with Differential/Platelet - Comprehensive metabolic panel with GFR - TSH  7. Vitamin D  deficiency - VITAMIN D  25 Hydroxy (Vit-D Deficiency, Fractures)  8. Immunity to hepatitis B virus demonstrated by serologic test - Unsure about hepatitis vaccine status - Hepatitis B surface antibody, qualitative  9. Screening for lipid disorders - Lipid panel   Patient educated on DTAP vaccine. Encouraged to take vaccine with any wounds or burns. This can be done at local pharmacy. Patient educated on flu vaccine. Encouraged to take vaccine due to the common spread of the illness and the ability to decrease symptoms.   Return in about 1 year (around 06/20/2025) for physical.   I have seen and examined this patient alongside the NP student. I have reviewed and verified the student note and agree with the assessment and plan.  Elveria Quarry, FNP

## 2024-06-21 ENCOUNTER — Encounter: Payer: Self-pay | Admitting: Nurse Practitioner

## 2024-06-22 ENCOUNTER — Ambulatory Visit: Payer: Self-pay | Admitting: Nurse Practitioner

## 2024-06-22 LAB — IGP,CTNGTV,APT HPV
Chlamydia, Nuc. Acid Amp: NEGATIVE
Gonococcus, Nuc. Acid Amp: NEGATIVE
HPV Aptima: NEGATIVE
Trich vag by NAA: NEGATIVE

## 2024-07-13 ENCOUNTER — Other Ambulatory Visit: Payer: Self-pay | Admitting: Nurse Practitioner

## 2024-07-13 DIAGNOSIS — E559 Vitamin D deficiency, unspecified: Secondary | ICD-10-CM

## 2024-07-13 LAB — CBC WITH DIFFERENTIAL/PLATELET
Basophils Absolute: 0.1 x10E3/uL (ref 0.0–0.2)
Basos: 1 %
EOS (ABSOLUTE): 0.1 x10E3/uL (ref 0.0–0.4)
Eos: 1 %
Hematocrit: 38.7 % (ref 34.0–46.6)
Hemoglobin: 13.2 g/dL (ref 11.1–15.9)
Immature Grans (Abs): 0 x10E3/uL (ref 0.0–0.1)
Immature Granulocytes: 0 %
Lymphocytes Absolute: 1.9 x10E3/uL (ref 0.7–3.1)
Lymphs: 25 %
MCH: 31.3 pg (ref 26.6–33.0)
MCHC: 34.1 g/dL (ref 31.5–35.7)
MCV: 92 fL (ref 79–97)
Monocytes Absolute: 0.5 x10E3/uL (ref 0.1–0.9)
Monocytes: 7 %
Neutrophils Absolute: 5.1 x10E3/uL (ref 1.4–7.0)
Neutrophils: 66 %
Platelets: 226 x10E3/uL (ref 150–450)
RBC: 4.22 x10E6/uL (ref 3.77–5.28)
RDW: 12.7 % (ref 11.7–15.4)
WBC: 7.8 x10E3/uL (ref 3.4–10.8)

## 2024-07-13 LAB — HEPATITIS B SURFACE ANTIBODY,QUALITATIVE: Hep B Surface Ab, Qual: NONREACTIVE

## 2024-07-13 LAB — LIPID PANEL
Chol/HDL Ratio: 2.3 ratio (ref 0.0–4.4)
Cholesterol, Total: 113 mg/dL (ref 100–199)
HDL: 49 mg/dL (ref >39)
LDL Chol Calc (NIH): 52 mg/dL (ref 0–99)
Triglycerides: 49 mg/dL (ref 0–149)
VLDL Cholesterol Cal: 12 mg/dL (ref 5–40)

## 2024-07-13 LAB — VITAMIN D 25 HYDROXY (VIT D DEFICIENCY, FRACTURES): Vit D, 25-Hydroxy: 11.5 ng/mL — ABNORMAL LOW (ref 30.0–100.0)

## 2024-07-13 LAB — COMPREHENSIVE METABOLIC PANEL WITH GFR
ALT: 19 IU/L (ref 0–32)
AST: 19 IU/L (ref 0–40)
Albumin: 4 g/dL (ref 3.9–4.9)
Alkaline Phosphatase: 54 IU/L (ref 41–116)
BUN/Creatinine Ratio: 12 (ref 9–23)
BUN: 14 mg/dL (ref 6–24)
Bilirubin Total: 0.9 mg/dL (ref 0.0–1.2)
CO2: 20 mmol/L (ref 20–29)
Calcium: 8.8 mg/dL (ref 8.7–10.2)
Chloride: 108 mmol/L — ABNORMAL HIGH (ref 96–106)
Creatinine, Ser: 1.13 mg/dL — ABNORMAL HIGH (ref 0.57–1.00)
Globulin, Total: 2.2 g/dL (ref 1.5–4.5)
Glucose: 102 mg/dL — ABNORMAL HIGH (ref 70–99)
Potassium: 3.7 mmol/L (ref 3.5–5.2)
Sodium: 141 mmol/L (ref 134–144)
Total Protein: 6.2 g/dL (ref 6.0–8.5)
eGFR: 60 mL/min/1.73 (ref >59)

## 2024-07-13 LAB — TSH: TSH: 1.82 u[IU]/mL (ref 0.450–4.500)

## 2024-07-13 MED ORDER — VITAMIN D (ERGOCALCIFEROL) 1.25 MG (50000 UNIT) PO CAPS: 50000.0000 [IU] | ORAL_CAPSULE | ORAL | 3 refills | Status: AC

## 2024-07-18 ENCOUNTER — Ambulatory Visit (HOSPITAL_COMMUNITY)
Admission: RE | Admit: 2024-07-18 | Discharge: 2024-07-18 | Disposition: A | Discharge status: Home / Self Care | Source: Ambulatory Visit | Attending: Nurse Practitioner | Admitting: Nurse Practitioner

## 2024-07-18 DIAGNOSIS — Z1231 Encounter for screening mammogram for malignant neoplasm of breast: Secondary | ICD-10-CM | POA: Insufficient documentation

## 2024-10-25 ENCOUNTER — Ambulatory Visit: Admitting: Physician Assistant

## 2024-10-26 ENCOUNTER — Ambulatory Visit: Admitting: Physician Assistant

## 2024-10-26 ENCOUNTER — Encounter: Payer: Self-pay | Admitting: Physician Assistant

## 2024-10-26 VITALS — BP 138/85 | HR 89 | Temp 97.8°F | Ht 68.5 in | Wt 199.2 lb

## 2024-10-26 DIAGNOSIS — J020 Streptococcal pharyngitis: Secondary | ICD-10-CM | POA: Diagnosis not present

## 2024-10-26 LAB — POCT RAPID STREP A (OFFICE): Rapid Strep A Screen: POSITIVE — AB

## 2024-10-26 MED ORDER — AMOXICILLIN-POT CLAVULANATE 875-125 MG PO TABS: 1.0000 | ORAL_TABLET | Freq: Two times a day (BID) | ORAL | 0 refills | Status: AC

## 2024-10-26 NOTE — Assessment & Plan Note (Signed)
 Confirmed by throat swab. Symptoms include sore throat, cough, and headache. No fever reported. No difficulty swallowing, only soreness. No known antibiotic allergies. - Prescribed Augmentin  twice a day for 10 days. Advised to take Augmentin  with breakfast and dinner to minimize gastrointestinal discomfort. Instructed to monitor for worsening diarrhea as a potential side effect of antibiotics. Recommended probiotic or increased Greek yogurt intake to mitigate potential GI symptoms. - Continue Mucinex and over-the-counter cold and flu medications as needed. - Ensure adequate hydration with water. - Provided work note for absence from Monday through Thursday, with return on Friday if feeling well.

## 2024-10-26 NOTE — Progress Notes (Signed)
 "  Acute Office Visit  Subjective:     Patient ID: Meagan Bell, female    DOB: Nov 26, 1975, 49 y.o.   MRN: [id number]   Discussed the use of AI scribe software for clinical note transcription with the patient, who gave verbal consent to proceed.  History of Present Illness Meagan Bell is a 49 year old female who presents with sore throat, cough, and diarrhea.  Symptoms began on Saturday with a sore throat and headache, followed by a cough, runny nose, and diarrhea. No fever, and she maintains her appetite and is eating well. The diarrhea started yesterday, occurring two to three times in total. She has been managing symptoms with Nyquil, Mucinex, hot tea, and lemons. The cough was initially dry, waking her up, and then became productive. No chest pain or difficulty breathing. No difficulty swallowing, only a sore throat. She has no known antibiotic allergies and has not had strep throat before. She has been out of work this week due to her symptoms and is inquiring about returning to work.    Review of Systems  Constitutional:  Positive for fatigue. Negative for appetite change and fever.  HENT:  Positive for congestion and sore throat. Negative for ear discharge, ear pain, mouth sores and trouble swallowing.   Respiratory:  Positive for cough. Negative for shortness of breath.   Cardiovascular:  Negative for chest pain.  Gastrointestinal:  Positive for diarrhea.  Neurological:  Positive for headaches. Negative for light-headedness.         Objective:     BP 138/85   Pulse 89   Temp 97.8 F (36.6 C)   Ht 5' 8.5 (1.74 m)   Wt 199 lb 3.2 oz (90.4 kg)   SpO2 97%   BMI 29.85 kg/m   Physical Exam Vitals reviewed.  Constitutional:      General: She is not in acute distress.    Appearance: Normal appearance. She is ill-appearing.  HENT:     Head: Normocephalic and atraumatic.     Right Ear: Tympanic membrane normal.     Left Ear: Tympanic membrane normal.     Nose:  Congestion present.     Mouth/Throat:     Mouth: Mucous membranes are moist.     Pharynx: Oropharynx is clear. Posterior oropharyngeal erythema present.     Tonsils: Tonsillar exudate present.  Eyes:     Extraocular Movements: Extraocular movements intact.     Conjunctiva/sclera: Conjunctivae normal.  Cardiovascular:     Rate and Rhythm: Normal rate and regular rhythm.     Heart sounds: Normal heart sounds. No murmur heard. Pulmonary:     Effort: Pulmonary effort is normal.     Breath sounds: Normal breath sounds. No wheezing, rhonchi or rales.  Abdominal:     General: Abdomen is flat.     Palpations: Abdomen is soft.  Musculoskeletal:     Cervical back: No tenderness.  Lymphadenopathy:     Cervical: Cervical adenopathy present.  Skin:    General: Skin is warm and dry.  Neurological:     General: No focal deficit present.     Mental Status: She is alert and oriented to person, place, and time.  Psychiatric:        Mood and Affect: Mood normal.        Behavior: Behavior normal.     Results for orders placed or performed in visit on 10/26/24  Rapid Strep A  Result Value Ref Range   Rapid Strep  A Screen Positive (A) Negative        Assessment & Plan:  Strep pharyngitis Assessment & Plan: Confirmed by throat swab. Symptoms include sore throat, cough, and headache. No fever reported. No difficulty swallowing, only soreness. No known antibiotic allergies. - Prescribed Augmentin  twice a day for 10 days. Advised to take Augmentin  with breakfast and dinner to minimize gastrointestinal discomfort. Instructed to monitor for worsening diarrhea as a potential side effect of antibiotics. Recommended probiotic or increased Greek yogurt intake to mitigate potential GI symptoms. - Continue Mucinex and over-the-counter cold and flu medications as needed. - Ensure adequate hydration with water. - Provided work note for absence from Monday through Thursday, with return on Friday if feeling  well.  Orders: -     POCT rapid strep A -     Amoxicillin -Pot Clavulanate; Take 1 tablet by mouth 2 (two) times daily.  Dispense: 20 tablet; Refill: 0    Return if symptoms worsen or fail to improve.  Aliyah Abeyta, PA-C  "
